# Patient Record
Sex: Female | Born: 1970 | Race: Black or African American | Hispanic: No | Marital: Married | Smoking: Never smoker
Health system: Southern US, Community
[De-identification: ages and names within clinical notes are randomized; demographics above are authoritative.]

## PROBLEM LIST (undated history)

## (undated) DIAGNOSIS — F32A Depression, unspecified: Secondary | ICD-10-CM

## (undated) DIAGNOSIS — E039 Hypothyroidism, unspecified: Secondary | ICD-10-CM

## (undated) DIAGNOSIS — D638 Anemia in other chronic diseases classified elsewhere: Secondary | ICD-10-CM

## (undated) DIAGNOSIS — I9589 Other hypotension: Secondary | ICD-10-CM

## (undated) DIAGNOSIS — G4730 Sleep apnea, unspecified: Secondary | ICD-10-CM

## (undated) DIAGNOSIS — N186 End stage renal disease: Secondary | ICD-10-CM

## (undated) DIAGNOSIS — R079 Chest pain, unspecified: Secondary | ICD-10-CM

## (undated) DIAGNOSIS — E669 Obesity, unspecified: Secondary | ICD-10-CM

## (undated) DIAGNOSIS — M25551 Pain in right hip: Secondary | ICD-10-CM

## (undated) DIAGNOSIS — E119 Type 2 diabetes mellitus without complications: Secondary | ICD-10-CM

## (undated) DIAGNOSIS — G8929 Other chronic pain: Secondary | ICD-10-CM

## (undated) DIAGNOSIS — I5189 Other ill-defined heart diseases: Secondary | ICD-10-CM

## (undated) DIAGNOSIS — E785 Hyperlipidemia, unspecified: Secondary | ICD-10-CM

## (undated) DIAGNOSIS — E875 Hyperkalemia: Secondary | ICD-10-CM

## (undated) DIAGNOSIS — F329 Major depressive disorder, single episode, unspecified: Secondary | ICD-10-CM

---

## 2018-11-24 ENCOUNTER — Encounter: Payer: Self-pay | Admitting: Emergency Medicine

## 2018-11-24 ENCOUNTER — Emergency Department
Admission: EM | Admit: 2018-11-24 | Discharge: 2018-11-24 | Disposition: A | Discharge status: Skilled Nursing Facility | Payer: Medicare Other | Attending: Emergency Medicine | Admitting: Emergency Medicine

## 2018-11-24 DIAGNOSIS — E1122 Type 2 diabetes mellitus with diabetic chronic kidney disease: Secondary | ICD-10-CM | POA: Insufficient documentation

## 2018-11-24 DIAGNOSIS — Z992 Dependence on renal dialysis: Secondary | ICD-10-CM | POA: Insufficient documentation

## 2018-11-24 DIAGNOSIS — N186 End stage renal disease: Secondary | ICD-10-CM | POA: Insufficient documentation

## 2018-11-24 HISTORY — DX: Sleep apnea, unspecified: G47.30

## 2018-11-24 HISTORY — DX: Hyperkalemia: E87.5

## 2018-11-24 LAB — BASIC METABOLIC PANEL
Anion gap: 18 — ABNORMAL HIGH (ref 5–15)
BUN: 86 mg/dL — ABNORMAL HIGH (ref 6–20)
CALCIUM: 8.9 mg/dL (ref 8.9–10.3)
CO2: 19 mmol/L — AB (ref 22–32)
CREATININE: 8.63 mg/dL — AB (ref 0.44–1.00)
Chloride: 96 mmol/L — ABNORMAL LOW (ref 98–111)
GFR calc Af Amer: 6 mL/min — ABNORMAL LOW (ref >60)
GFR calc non Af Amer: 5 mL/min — ABNORMAL LOW (ref >60)
Glucose, Bld: 84 mg/dL (ref 70–99)
Potassium: 3.9 mmol/L (ref 3.5–5.1)
Sodium: 133 mmol/L — ABNORMAL LOW (ref 135–145)

## 2018-11-24 NOTE — Discharge Instructions (Addendum)
Please seek medical attention for any high fevers, chest pain, shortness of breath, change in behavior, persistent vomiting, bloody stool or any other new or concerning symptoms.  

## 2018-11-24 NOTE — ED Notes (Signed)
Patient had a large bowel movement.  This RN and Mel, EDT and Kadijah, EDT assisted to clean patient.

## 2018-11-24 NOTE — ED Notes (Signed)
This RN called [place] [facility] and informed staff of the plan of care for the patient at this time.

## 2018-11-24 NOTE — ED Notes (Signed)
Social worker informed that [facility] is refusing to accept patient back to facility.

## 2018-11-24 NOTE — ED Triage Notes (Signed)
Patient presents to the ED via EMS for dialysis treatment.  Patient's wheelchair did not fit in the wheelchair Lucianne Lei that was going to take patient to dialysis.  Patient is in no obvious distress at this time.

## 2018-11-24 NOTE — ED Provider Notes (Signed)
[facility] Emergency Department Provider Note   ____________________________________________   I have reviewed the triage vital signs and the nursing notes.   HISTORY  Chief Complaint Vascular Access Problem (Facility did not have appropriate equipment to transfer patient to dialysis.  )   History limited by: Not Limited   HPI Lindsey Russell is a 48 y.o. female who presents to the emergency department today because she was unable to be transported to dialysis today. Patient gets dialysis Saturday, Tuesday and Thursday and states she did get her dialysis session last Saturday. The patient denies any chest pain. Denies any shortness of breath. Apparently her wheelchair did not fit in the transport vehicle today.  Per medical record review patient has a history of end stage renal disease, DM.  Past Medical History:  Diagnosis Date  . Diabetes mellitus without complication ([place])   . Hyperkalemia   . Renal disorder    end stage renal disease  . Sleep apnea   . Thyroid disease     There are no active problems to display for this patient.   Prior to Admission medications   Not on File    Allergies Morphine and related and Naproxen  No family history on file.  Social History Social History   Tobacco Use  . Smoking status: Never Smoker  . Smokeless tobacco: Never Used  Substance Use Topics  . Alcohol use: Never    Frequency: Never  . Drug use: Not on file    Review of Systems Constitutional: No fever/chills Eyes: No visual changes. ENT: No sore throat. Cardiovascular: Denies chest pain. Respiratory: Denies shortness of breath. Gastrointestinal: Positive for diarrhea.  Genitourinary: Negative for dysuria. Musculoskeletal: Negative for back pain. Skin: Negative for rash. Neurological: Negative for headaches, focal weakness or numbness.  ____________________________________________   PHYSICAL EXAM:  VITAL SIGNS: ED Triage Vitals  [11/24/18 1508]  Enc Vitals Group     BP (!) 145/116     Pulse Rate 94     Resp 16     Temp 97.6 F (36.4 C)     Temp Source Oral     SpO2 95 %     Weight      Height      Head Circumference      Peak Flow      Pain Score 4   Constitutional: Alert and oriented.  Eyes: Conjunctivae are normal.  ENT      Head: Normocephalic and atraumatic.      Nose: No congestion/rhinnorhea.      Mouth/Throat: Mucous membranes are moist.      Neck: No stridor. Hematological/Lymphatic/Immunilogical: No cervical lymphadenopathy. Cardiovascular: Normal rate, regular rhythm.  No murmurs, rubs, or gallops. Respiratory: Normal respiratory effort without tachypnea nor retractions. Breath sounds are clear and equal bilaterally. No wheezes/rales/rhonchi. Gastrointestinal: Soft and non tender. No rebound. No guarding.  Genitourinary: Deferred Musculoskeletal: Normal range of motion in all extremities. No lower extremity edema. Neurologic:  Normal speech and language. No gross focal neurologic deficits are appreciated.  Skin:  Skin is warm, dry and intact. No rash noted. Psychiatric: Mood and affect are normal. Speech and behavior are normal. Patient exhibits appropriate insight and judgment.  ____________________________________________    LABS (pertinent positives/negatives)  BMP K 3.9  ____________________________________________   EKG  None  ____________________________________________    RADIOLOGY  None  ____________________________________________   PROCEDURES  Procedures  ____________________________________________   INITIAL IMPRESSION / ASSESSMENT AND PLAN / ED COURSE  Pertinent labs & imaging  results that were available during my care of the patient were reviewed by me and considered in my medical decision making (see chart for details).   Patient was sent to the emergency department today because they cannot arrange transport to her dialysis center.  Patient denies  any shortness of breath.  Is not in any respiratory distress.  Potassium 3.9.  At this point do not feel patient requires emergent dialysis. Will discharge back to living facility.   ____________________________________________   FINAL CLINICAL IMPRESSION(S) / ED DIAGNOSES  Final diagnoses:  Dialysis patient [facility]     Note: This dictation was prepared with Dragon dictation. Any transcriptional errors that result from this process are unintentional     Nance Pear, MD 11/24/18 [id number]

## 2018-11-24 NOTE — ED Notes (Signed)
Facility states they cannot accept patient back because her wheelchair is too large for the [place].  Spoke to [employer], Marine scientist.

## 2018-11-24 NOTE — ED Notes (Signed)
Called [facility] for transport to [place]

## 2018-11-24 NOTE — ED Notes (Signed)
This RN touched base with social work at this time.  Social worker states they are working on a plan for the patient.  Will continue to monitor.

## 2018-11-24 NOTE — Clinical Social Work Note (Signed)
Per RN patient was brought to ED today from [facility] due to not being able to fit in the transport vehicle for dialysis. CSW spoke with Claiborne Billings, liaison for [facility]. Per Claiborne Billings they have not been able to get patient into the transport vehicle due to weight (432 pounds) Claiborne Billings spoke with her Director of [place] and they have agreed to accept patient back to [facility] today. CSW notified RN Vicente Males of above. Patient will be discharged back to [facility] by EMS today.   [place], [place]

## 2018-11-24 NOTE — ED Notes (Signed)
Report given to staff at [facility] at this time.  Informed of transport back to facility.

## 2018-11-30 ENCOUNTER — Emergency Department
Admission: EM | Admit: 2018-11-30 | Discharge: 2018-12-01 | Disposition: A | Discharge status: Skilled Nursing Facility | Payer: Medicare Other | Attending: Emergency Medicine | Admitting: Emergency Medicine

## 2018-11-30 ENCOUNTER — Other Ambulatory Visit: Payer: Self-pay

## 2018-11-30 DIAGNOSIS — E1122 Type 2 diabetes mellitus with diabetic chronic kidney disease: Secondary | ICD-10-CM | POA: Diagnosis not present

## 2018-11-30 DIAGNOSIS — Y69 Unspecified misadventure during surgical and medical care: Secondary | ICD-10-CM | POA: Insufficient documentation

## 2018-11-30 DIAGNOSIS — T8242XA Displacement of vascular dialysis catheter, initial encounter: Secondary | ICD-10-CM | POA: Insufficient documentation

## 2018-11-30 DIAGNOSIS — N186 End stage renal disease: Secondary | ICD-10-CM | POA: Insufficient documentation

## 2018-11-30 LAB — COMPREHENSIVE METABOLIC PANEL
ALT: 9 U/L (ref 0–44)
AST: 13 U/L — ABNORMAL LOW (ref 15–41)
Albumin: 2.8 g/dL — ABNORMAL LOW (ref 3.5–5.0)
Alkaline Phosphatase: 88 U/L (ref 38–126)
Anion gap: 16 — ABNORMAL HIGH (ref 5–15)
BILIRUBIN TOTAL: 0.6 mg/dL (ref 0.3–1.2)
BUN: 70 mg/dL — ABNORMAL HIGH (ref 6–20)
CO2: 23 mmol/L (ref 22–32)
Calcium: 8.1 mg/dL — ABNORMAL LOW (ref 8.9–10.3)
Chloride: 98 mmol/L (ref 98–111)
Creatinine, Ser: 6.69 mg/dL — ABNORMAL HIGH (ref 0.44–1.00)
GFR calc Af Amer: 8 mL/min — ABNORMAL LOW (ref >60)
GFR calc non Af Amer: 7 mL/min — ABNORMAL LOW (ref >60)
Glucose, Bld: 95 mg/dL (ref 70–99)
POTASSIUM: 3.8 mmol/L (ref 3.5–5.1)
Sodium: 137 mmol/L (ref 135–145)
TOTAL PROTEIN: 6.5 g/dL (ref 6.5–8.1)

## 2018-11-30 LAB — CBC WITH DIFFERENTIAL/PLATELET
Abs Immature Granulocytes: 0.07 10*3/uL (ref 0.00–0.07)
Basophils Absolute: 0 10*3/uL (ref 0.0–0.1)
Basophils Relative: 1 %
Eosinophils Absolute: 0.1 10*3/uL (ref 0.0–0.5)
Eosinophils Relative: 2 %
HCT: 33.5 % — ABNORMAL LOW (ref 36.0–46.0)
Hemoglobin: 10.1 g/dL — ABNORMAL LOW (ref 12.0–15.0)
Immature Granulocytes: 1 %
Lymphocytes Relative: 17 %
Lymphs Abs: 1 10*3/uL (ref 0.7–4.0)
MCH: 26.7 pg (ref 26.0–34.0)
MCHC: 30.1 g/dL (ref 30.0–36.0)
MCV: 88.6 fL (ref 80.0–100.0)
Monocytes Absolute: 0.4 10*3/uL (ref 0.1–1.0)
Monocytes Relative: 7 %
NRBC: 0 % (ref 0.0–0.2)
Neutro Abs: 4.1 10*3/uL (ref 1.7–7.7)
Neutrophils Relative %: 72 %
Platelets: 91 10*3/uL — ABNORMAL LOW (ref 150–400)
RBC: 3.78 MIL/uL — ABNORMAL LOW (ref 3.87–5.11)
RDW: 15.9 % — AB (ref 11.5–15.5)
WBC: 5.6 10*3/uL (ref 4.0–10.5)

## 2018-11-30 MED ORDER — OXYCODONE-ACETAMINOPHEN 5-325 MG PO TABS
2.0000 | ORAL_TABLET | Freq: Once | ORAL | Status: AC
  Administered 2018-11-30: 2 via ORAL
  Filled 2018-11-30: qty 2

## 2018-11-30 NOTE — ED Notes (Signed)
Attempted to call [facility]. No answer. Will try again in 10 mins.

## 2018-11-30 NOTE — ED Notes (Signed)
Called facility multiple times. No answer. Will continue to call.

## 2018-11-30 NOTE — ED Notes (Signed)
Patient states she has dialysis 3 times a week on Tuesday, Thursday and Saturday. Patient states she made a mistake and accidentally pulled out her subclavian cath.

## 2018-11-30 NOTE — ED Provider Notes (Signed)
[facility] Emergency Department Provider Note       Time seen: ----------------------------------------- 7:49 PM on 11/30/2018 -----------------------------------------   I have reviewed the triage vital signs and the nursing notes.  HISTORY   Chief Complaint No chief complaint on file.   HPI Lindsey Russell is a 48 y.o. female with a history of diabetes, hyperkalemia, end-stage renal disease on dialysis, morbid obesity who presents to the ED for dialysis catheter dislodgment.  She has a right chest tunneled catheter that she accidentally pulled out tonight.  She denies any other injuries or complaints.  Past Medical History:  Diagnosis Date  . Diabetes mellitus without complication ([place])   . Hyperkalemia   . Renal disorder    end stage renal disease  . Sleep apnea   . Thyroid disease     There are no active problems to display for this patient.  Allergies Morphine and related and Naproxen  Social History Social History   Tobacco Use  . Smoking status: Never Smoker  . Smokeless tobacco: Never Used  Substance Use Topics  . Alcohol use: Never    Frequency: Never  . Drug use: Not on file   Review of Systems Constitutional: Negative for fever. Cardiovascular: Negative for chest pain. Respiratory: Negative for shortness of breath. Gastrointestinal: Negative for abdominal pain, vomiting and diarrhea. Musculoskeletal: Negative for back pain. Skin: Negative for rash. Neurological: Negative for headaches, focal weakness or numbness.  All systems negative/normal/unremarkable except as stated in the HPI  ____________________________________________   PHYSICAL EXAM:  VITAL SIGNS: ED Triage Vitals  Enc Vitals Group     BP      Pulse      Resp      Temp      Temp src      SpO2      Weight      Height      Head Circumference      Peak Flow      Pain Score      Pain Loc      Pain Edu?      Excl. in [place]?    Constitutional: Alert and  oriented.  Morbidly obese Cardiovascular: Normal rate, regular rhythm. No murmurs, rubs, or gallops.  Right upper chest catheter has been completely dislodged. Respiratory: Normal respiratory effort without tachypnea nor retractions. Breath sounds are clear and equal bilaterally. No wheezes/rales/rhonchi. Musculoskeletal: Severe and chronic appearing edema in the legs Neurologic:  Normal speech and language. No gross focal neurologic deficits are appreciated.  Skin:  Skin is warm, dry and intact. No rash noted. Psychiatric: Mood and affect are normal. Speech and behavior are normal.  ____________________________________________  ED COURSE:  As part of my medical decision making, I reviewed the following data within the [place] History obtained from family if available, nursing notes, old chart and ekg, as well as notes from prior ED visits. Patient presented for dialysis catheter dislodgment, we will assess with labs and discussed with nephrology   Procedures ____________________________________________   LABS (pertinent positives/negatives)  Labs Reviewed  COMPREHENSIVE METABOLIC PANEL - Abnormal; Notable for the following components:      Result Value   BUN 70 (*)    Creatinine, Ser 6.69 (*)    Calcium 8.1 (*)    Albumin 2.8 (*)    AST 13 (*)    GFR calc non Af Amer 7 (*)    GFR calc Af Amer 8 (*)    Anion gap 16 (*)  All other components within normal limits  CBC WITH DIFFERENTIAL/PLATELET   ____________________________________________   DIFFERENTIAL DIAGNOSIS   Dialysis catheter dislodgment, end-stage renal disease  FINAL ASSESSMENT AND PLAN  Dialysis catheter displacement   Plan: The patient had presented for accidentally removing her dialysis catheter. Patient's labs are reassuring.  I have discussed with both nephrology and vascular surgery.  As long as she can call the office of vascular surgery in the morning around 9:00 they can arrange for  her to have a dialysis catheter placed.  Patient is agreeable to the plan.Laurence Aly, MD    Note: This note was generated in part or whole with voice recognition software. Voice recognition is usually quite accurate but there are transcription errors that can and very often do occur. I apologize for any typographical errors that were not detected and corrected.     Earleen Newport, MD 11/30/18 2149

## 2018-11-30 NOTE — ED Triage Notes (Signed)
Pt arrived via [place] from [facility] with c/o subclavian catheter pulled out. EMS states catheter was pulled out at [facility] care with unknown of how it happened. Pt stated that when she woke up with it out.

## 2018-11-30 NOTE — ED Notes (Signed)
IV placed by IV team and lab obtained.

## 2018-11-30 NOTE — ED Notes (Signed)
Report given to Nurse at [facility] care. Informed vascular surgery needs to be called before or by 9am.

## 2018-12-01 ENCOUNTER — Other Ambulatory Visit (INDEPENDENT_AMBULATORY_CARE_PROVIDER_SITE_OTHER): Payer: Self-pay | Admitting: Nurse Practitioner

## 2018-12-02 ENCOUNTER — Encounter
Admission: RE | Disposition: A | Discharge status: Skilled Nursing Facility | Payer: Self-pay | Source: Home / Self Care | Attending: Vascular Surgery

## 2018-12-02 ENCOUNTER — Ambulatory Visit
Admission: RE | Admit: 2018-12-02 | Discharge: 2018-12-02 | Disposition: A | Discharge status: Skilled Nursing Facility | Payer: Medicare Other | Attending: Vascular Surgery | Admitting: Vascular Surgery

## 2018-12-02 ENCOUNTER — Other Ambulatory Visit: Payer: Self-pay

## 2018-12-02 DIAGNOSIS — N186 End stage renal disease: Secondary | ICD-10-CM | POA: Diagnosis not present

## 2018-12-02 DIAGNOSIS — Z6841 Body Mass Index (BMI) 40.0 and over, adult: Secondary | ICD-10-CM

## 2018-12-02 DIAGNOSIS — Z886 Allergy status to analgesic agent status: Secondary | ICD-10-CM | POA: Insufficient documentation

## 2018-12-02 DIAGNOSIS — E1122 Type 2 diabetes mellitus with diabetic chronic kidney disease: Secondary | ICD-10-CM

## 2018-12-02 DIAGNOSIS — I871 Compression of vein: Secondary | ICD-10-CM

## 2018-12-02 DIAGNOSIS — T8249XA Other complication of vascular dialysis catheter, initial encounter: Secondary | ICD-10-CM | POA: Diagnosis not present

## 2018-12-02 DIAGNOSIS — I82602 Acute embolism and thrombosis of unspecified veins of left upper extremity: Secondary | ICD-10-CM | POA: Insufficient documentation

## 2018-12-02 DIAGNOSIS — E079 Disorder of thyroid, unspecified: Secondary | ICD-10-CM | POA: Insufficient documentation

## 2018-12-02 DIAGNOSIS — Z885 Allergy status to narcotic agent status: Secondary | ICD-10-CM | POA: Insufficient documentation

## 2018-12-02 DIAGNOSIS — Z992 Dependence on renal dialysis: Secondary | ICD-10-CM

## 2018-12-02 DIAGNOSIS — G4730 Sleep apnea, unspecified: Secondary | ICD-10-CM | POA: Insufficient documentation

## 2018-12-02 HISTORY — PX: DIALYSIS/PERMA CATHETER INSERTION: CATH118288

## 2018-12-02 LAB — GLUCOSE, CAPILLARY
GLUCOSE-CAPILLARY: 76 mg/dL (ref 70–99)
Glucose-Capillary: 73 mg/dL (ref 70–99)
Glucose-Capillary: 115 mg/dL — ABNORMAL HIGH (ref 70–99)

## 2018-12-02 LAB — POTASSIUM (ARMC VASCULAR LAB ONLY): Potassium (ARMC vascular lab): 4.3 (ref 3.5–5.1)

## 2018-12-02 SURGERY — DIALYSIS/PERMA CATHETER INSERTION
Anesthesia: Moderate Sedation

## 2018-12-02 MED ORDER — MIDAZOLAM HCL 2 MG/2ML IJ SOLN
INTRAMUSCULAR | Status: AC
  Filled 2018-12-02: qty 4

## 2018-12-02 MED ORDER — CEFAZOLIN SODIUM-DEXTROSE 1-4 GM/50ML-% IV SOLN
1.0000 g | Freq: Once | INTRAVENOUS | Status: AC
  Administered 2018-12-02: 1 g via INTRAVENOUS

## 2018-12-02 MED ORDER — MIDAZOLAM HCL 2 MG/2ML IJ SOLN
INTRAMUSCULAR | Status: DC | PRN
  Administered 2018-12-02: 2 mg via INTRAVENOUS

## 2018-12-02 MED ORDER — FENTANYL CITRATE (PF) 100 MCG/2ML IJ SOLN
INTRAMUSCULAR | Status: AC
  Filled 2018-12-02: qty 2

## 2018-12-02 MED ORDER — METHYLPREDNISOLONE SODIUM SUCC 125 MG IJ SOLR: 125.0000 mg | Freq: Once | INTRAMUSCULAR | Status: DC | PRN

## 2018-12-02 MED ORDER — DEXTROSE 50 % IV SOLN
1.0000 | Freq: Once | INTRAVENOUS | Status: AC
  Administered 2018-12-02: 50 mL via INTRAVENOUS

## 2018-12-02 MED ORDER — DEXTROSE 50 % IV SOLN
INTRAVENOUS | Status: AC
  Filled 2018-12-02: qty 50

## 2018-12-02 MED ORDER — ONDANSETRON HCL 4 MG/2ML IJ SOLN: 4.0000 mg | Freq: Four times a day (QID) | INTRAMUSCULAR | Status: DC | PRN

## 2018-12-02 MED ORDER — SODIUM CHLORIDE 0.9 % IV SOLN
INTRAVENOUS | Status: DC
  Administered 2018-12-02: 15:00:00 via INTRAVENOUS

## 2018-12-02 MED ORDER — IOHEXOL 300 MG/ML  SOLN
INTRAMUSCULAR | Status: DC | PRN
  Administered 2018-12-02: 25 mL via INTRAVENOUS

## 2018-12-02 MED ORDER — FENTANYL CITRATE (PF) 100 MCG/2ML IJ SOLN
INTRAMUSCULAR | Status: DC | PRN
  Administered 2018-12-02: 18:00:00
  Administered 2018-12-02 (×2): 50 ug via INTRAVENOUS

## 2018-12-02 MED ORDER — MIDAZOLAM HCL 2 MG/ML PO SYRP: 8.0000 mg | ORAL_SOLUTION | Freq: Once | ORAL | Status: DC | PRN

## 2018-12-02 MED ORDER — FAMOTIDINE 20 MG PO TABS: 40.0000 mg | ORAL_TABLET | Freq: Once | ORAL | Status: DC | PRN

## 2018-12-02 MED ORDER — FENTANYL CITRATE (PF) 100 MCG/2ML IJ SOLN: 12.5000 ug | Freq: Once | INTRAMUSCULAR | Status: DC | PRN

## 2018-12-02 MED ORDER — MIDAZOLAM HCL 2 MG/2ML IJ SOLN
INTRAMUSCULAR | Status: DC | PRN
  Administered 2018-12-02 (×2): 1 mg via INTRAVENOUS

## 2018-12-02 MED ORDER — LIDOCAINE-EPINEPHRINE (PF) 1 %-1:200000 IJ SOLN
INTRAMUSCULAR | Status: DC | PRN
  Administered 2018-12-02: 6 mL

## 2018-12-02 MED ORDER — DIPHENHYDRAMINE HCL 50 MG/ML IJ SOLN: 50.0000 mg | Freq: Once | INTRAMUSCULAR | Status: DC | PRN

## 2018-12-02 SURGICAL SUPPLY — 22 items
BALLN ULTRVRSE 12X80X75 (BALLOONS) ×3
BALLN ULTRVRSE 8X80X75 (BALLOONS) ×3
BALLOON ULTRVRSE 12X80X75 (BALLOONS) ×1 IMPLANT
BALLOON ULTRVRSE 8X80X75 (BALLOONS) ×1 IMPLANT
BIOPATCH RED 1 DISK 7.0 (GAUZE/BANDAGES/DRESSINGS) ×2 IMPLANT
BIOPATCH RED 1IN DISK 7.0MM (GAUZE/BANDAGES/DRESSINGS) ×1
CANNULA 5F STIFF (CANNULA) ×3 IMPLANT
CATH BEACON 5 .035 40 KMP TP (CATHETERS) ×1 IMPLANT
CATH BEACON 5 .038 40 KMP TP (CATHETERS) ×2
CATH DIALYSIS PAL 33 888814504 (CATHETERS) ×3 IMPLANT
CATH PALINDROME RT-P 15FX28CM (CATHETERS) ×3 IMPLANT
DEVICE PRESTO INFLATION (MISCELLANEOUS) ×3 IMPLANT
DEVICE TORQUE .025-.038 (MISCELLANEOUS) ×6 IMPLANT
GLIDEWIRE STIFF .35X180X3 HYDR (WIRE) ×6 IMPLANT
GUIDEWIRE SUPER STIFF .035X180 (WIRE) ×3 IMPLANT
PACK ANGIOGRAPHY (CUSTOM PROCEDURE TRAY) ×3 IMPLANT
SHEATH BRITE TIP 5FRX11 (SHEATH) ×3 IMPLANT
SHEATH PINNACLE 11FRX10 (SHEATH) ×3 IMPLANT
SUT MNCRL AB 4-0 PS2 18 (SUTURE) ×3 IMPLANT
SUT PROLENE 0 CT 1 30 (SUTURE) ×3 IMPLANT
SUT VIC AB 3-0 SH 27 (SUTURE) ×2
SUT VIC AB 3-0 SH 27X BRD (SUTURE) ×1 IMPLANT

## 2018-12-02 NOTE — Discharge Instructions (Signed)
Tunneled Catheter Insertion, Care After Refer to this sheet in the next few weeks. These instructions provide you with information about caring for yourself after your procedure. Your health care provider may also give you more specific instructions. Your treatment has been planned according to current medical practices, but problems sometimes occur. Call your health care provider if you have any problems or questions after your procedure. What can I expect after the procedure? After the procedure, it is common to have:  Some mild redness, swelling, and pain around your catheter site.  A small amount of blood or clear fluid coming from your incisions. Follow these instructions at home: Incision care  Check your incision areas every day for signs of infection. Check for: ? More redness, swelling, or pain. ? More fluid or blood. ? Warmth. ? Pus or a bad smell.  Follow instructions from your health care provider about how to take care of your incisions. Make sure you: ? Wash your hands with soap and water before you change your bandages (dressings). If soap and water are not available, use hand sanitizer. ? Change your dressings as told by your health care provider. Wash the area around your incisions with a germ-killing (antiseptic) solution when you change your dressing, as told by your health care provider. ? Leave stitches (sutures), skin glue, or adhesive strips in place. These skin closures may need to stay in place for 2 weeks or longer. If adhesive strip edges start to loosen and curl up, you may trim the loose edges. Do not remove adhesive strips completely unless your health care provider tells you to do that. Catheter Care   Wash your hands with soap and water before and after caring for your catheter. If soap and water are not available, use hand sanitizer.  Keep your catheter site and your dressings clean and dry.  Apply an antibiotic ointment to your catheter site as told by  your health care provider.  Flush your catheter as told by your health care provider. This helps prevent it from becoming clogged.  Do not open the caps on the ends of the catheter.  Do not pull on your catheter.  If your catheter is in your arm: ? Avoid wearing tight clothes or tight jewelry on your arm that has the catheter. ? Do not sleep with your head on the arm that has the catheter. ? Do not allow your blood pressure to be taken on the arm that has the catheter. ? Do not allow your blood to be drawn from the arm that has the catheter, except through the catheter itself. Medicines  Take over-the-counter and prescription medicines only as told by your health care provider.  If you were prescribed an antibiotic medicine, take it as told by your health care provider. Do not stop taking the antibiotic even if you start to feel better. Activity  Return to your normal activities as told by your health care provider. Ask your health care provider what activities are safe for you.  Do not lift anything that is heavier than 10 lb (4.5 kg) for 3 weeks or as long as told by your health care provider. Driving  Do not drive until your health care provider approves.  Do not drive or operate heavy machinery while taking prescription pain medicine. General instructions  Follow your health care provider's specific instructions for the type of catheter that you have.  Do not take baths, swim, or use a hot tub until your health care  provider approves.  Follow instructions from your health care provider about eating or drinking restrictions.  Wear compression stockings as told by your health care provider. These stockings help to prevent blood clots and reduce swelling in your legs.  Keep all follow-up visits as told by your health care provider. This is important. Contact a health care provider if:  You have more fluid or blood coming from your incisions.  You have more redness,  swelling, or pain at your incisions or around the area where your catheter is inserted.  Your incisions feel warm to the touch.  You feel unusually weak.  You feel nauseous.  Your catheter is not working properly.  You have blood or fluid draining from your catheter.  You are unable to flush your catheter. Get help right away if:  Your catheter breaks.  A hole develops in your catheter.  Your catheter comes loose or gets pulled completely out. If this happens, press on your catheter site firmly with your hand or a clean cloth until you get medical help.  Your catheter becomes blocked.  You have swelling in your arm, shoulder, neck, or face.  You develop chest pain.  You have difficulty breathing.  You feel dizzy or light-headed.  You have pus or a bad smell coming from your incisions.  You have a fever.  You develop bleeding from your catheter or your insertion site, and your bleeding does not stop. This information is not intended to replace advice given to you by your health care provider. Make sure you discuss any questions you have with your health care provider. Document Released: 09/02/2012 Document Revised: 05/19/2016 Document Reviewed: 06/12/2015 Elsevier Interactive Patient Education  2019 [place]. Moderate Conscious Sedation, Adult, Care After These instructions provide you with information about caring for yourself after your procedure. Your health care provider may also give you more specific instructions. Your treatment has been planned according to current medical practices, but problems sometimes occur. Call your health care provider if you have any problems or questions after your procedure. What can I expect after the procedure? After your procedure, it is common:  To feel sleepy for several hours.  To feel clumsy and have poor balance for several hours.  To have poor judgment for several hours.  To vomit if you eat too soon. Follow these  instructions at home: For at least 24 hours after the procedure:   Do not: ? Participate in activities where you could fall or become injured. ? Drive. ? Use heavy machinery. ? Drink alcohol. ? Take sleeping pills or medicines that cause drowsiness. ? Make important decisions or sign legal documents. ? Take care of children on your own.  Rest. Eating and drinking  Follow the diet recommended by your health care provider.  If you vomit: ? Drink water, juice, or soup when you can drink without vomiting. ? Make sure you have little or no nausea before eating solid foods. General instructions  Have a responsible adult stay with you until you are awake and alert.  Take over-the-counter and prescription medicines only as told by your health care provider.  If you smoke, do not smoke without supervision.  Keep all follow-up visits as told by your health care provider. This is important. Contact a health care provider if:  You keep feeling nauseous or you keep vomiting.  You feel light-headed.  You develop a rash.  You have a fever. Get help right away if:  You have trouble breathing. This  information is not intended to replace advice given to you by your health care provider. Make sure you discuss any questions you have with your health care provider. Document Released: 07/07/2013 Document Revised: 02/19/2016 Document Reviewed: 01/06/2016 Elsevier Interactive Patient Education  2019 [employer].

## 2018-12-02 NOTE — Op Note (Signed)
OPERATIVE NOTE    PRE-OPERATIVE DIAGNOSIS: 1. 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 Ultrasound guidance for vascular access to the left internal jugular vein 2. Catheter placement into the SVC and right atrium from left jugular approach 3. Left jugular venogram and superior venacavogram 4. Percutaneous transluminal angioplasty of the left jugular and innominate veins with 8 mm diameter angioplasty balloon 5. Percutaneous transluminal angioplasty of the superior vena cava with 8 and 12 mm diameter angioplasty balloons 6. Fluoroscopic guidance for placement of catheter 7. Placement of a 33 cm tip to cuff tunneled hemodialysis catheter via the left internal jugular vein  SURGEON: Leotis Pain, MD  ANESTHESIA:  Local with Moderate conscious sedation for approximately 50 minutes using 2 mg of Versed and 100 Mcg of Fentanyl  ESTIMATED BLOOD LOSS: 30 cc  FLUORO TIME: 8.6 minutes  CONTRAST: 5 cc  FINDING(S): 1.  Patent left internal jugular vein in the neck with occlusion at the clavicle.  There was reconstitution of the superior vena cava just above the right atrium but multiple collaterals were present.  SPECIMEN(S):  None  INDICATIONS:   Lindsey Russell is a 48 y.o. female who presents with dislodgment of her PermCath and no dialysis access.  The patient needs long term dialysis access for their ESRD, and a Permcath is necessary.  Risks and benefits are discussed and informed consent is obtained.    DESCRIPTION: After obtaining full informed written consent, the patient was brought back to the vascular suited. The patient's left neck and chest were sterilely prepped and draped in a sterile surgical field was created. Moderate conscious sedation was administered during a face to face encounter with the patient throughout the procedure with my supervision of the RN administering medicines and monitoring the patient's vital signs, pulse  oximetry, telemetry and mental status throughout from the start of the procedure until the patient was taken to the recovery room.  The left internal jugular vein was visualized with ultrasound and found to be patent in the neck. It was then accessed under direct ultrasound guidance and a permanent image was recorded. A wire was placed but would not easily traverse beyond the clavicle.  A micropuncture sheath was placed and imaging was performed initially through the micropuncture sheath demonstrating occlusion of the jugular and left innominate vein at the level of the clavicle.  There was faint reconstitution of the superior vena cava just above the right atrium on initial imaging.  At this point, we placed a 5 French sheath.  Tediously, I was able to cross the occlusion with a Kumpe catheter and a Glidewire.  I advanced a Kumpe catheter and confirmed intraluminal flow in the superior vena cava and then the right atrium.  Angioplasty was then performed an 8 mm diameter by 8 cm length angioplasty balloon was first inflated in the left jugular and innominate vein with a tight waist taken which resolved at about 10 atm.  It was then advanced the superior vena cava and inflated to 12 atm.  Both inflations were for 1 minute.  Attempts at advancing the catheter at this point were still unsuccessful and so I upsized to a 12 mm diameter by 8 cm length angioplasty balloon in the superior vena cava.  Again a tight waist was seen which resolved at about 8 atm.  At this point, completion imaging showed mild to moderate residual stenosis in both the left jugular, innominate, and superior vena cava but now with  flow and a catheter could then be placed.  After skin nick and dilatation, the peel-away sheath was placed over the wire. I then turned my attention to an area under the clavicle. Approximately 1-2 fingerbreadths below the clavicle a small counterincision was created and tunneled from the subclavicular incision to the  access site. Using fluoroscopic guidance, a 33 centimeter tip to cuff tunneled hemodialysis catheter was selected, and tunneled from the subclavicular incision to the access site. It was then placed through the peel-away sheath and the peel-away sheath was removed.  This was placed over the wire to ensure that access was not lost.  Using fluoroscopic guidance the catheter tips were parked in the right atrium. The appropriate distal connectors were placed. It withdrew blood well and flushed easily with heparinized saline and a concentrated heparin solution was then placed. It was secured to the chest wall with 2 Prolene sutures. The access incision was closed single 4-0 Monocryl. A 4-0 Monocryl pursestring suture was placed around the exit site. Sterile dressings were placed. The patient tolerated the procedure well and was taken to the recovery room in stable condition.  COMPLICATIONS: None  CONDITION: Stable  Leotis Pain  12/02/2018, 5:42 PM   This note was created with Dragon Medical transcription system. Any errors in dictation are purely unintentional.

## 2018-12-02 NOTE — Consult Note (Signed)
Lindsey Russell Consult Note  MRN : [id number]  Lindsey Russell is a 48 y.o. (1971-03-08) female who presents with chief complaint of No chief complaint on file. Lindsey Russell  History of Present Illness: I am asked to see the patient by Dr. Jimmye Norman in the ER after her Permcath "came out" earlier this week.  She has long standing ESRD and has been using this catheter for her HD. She feels well.  No complaints other than her typical SOB and weakness.  No fever or chills.    Current Facility-Administered Medications  Medication Dose Route Frequency Provider Last Rate Last Dose  . dextrose 50 % solution           . 0.9 %  sodium chloride infusion   Intravenous Continuous Kris Hartmann, NP 10 mL/hr at 12/02/18 1440    . ceFAZolin (ANCEF) IVPB 1 g/50 mL premix  1 g Intravenous Once Kris Hartmann, NP      . diphenhydrAMINE (BENADRYL) injection 50 mg  50 mg Intravenous Once PRN Kris Hartmann, NP      . famotidine (PEPCID) tablet 40 mg  40 mg Oral Once PRN Kris Hartmann, NP      . fentaNYL (SUBLIMAZE) injection 12.5 mcg  12.5 mcg Intravenous Once PRN Eulogio Ditch E, NP      . methylPREDNISolone sodium succinate (SOLU-MEDROL) 125 mg/2 mL injection 125 mg  125 mg Intravenous Once PRN Eulogio Ditch E, NP      . midazolam (VERSED) 2 MG/ML syrup 8 mg  8 mg Oral Once PRN Kris Hartmann, NP      . ondansetron (ZOFRAN) injection 4 mg  4 mg Intravenous Q6H PRN Kris Hartmann, NP        Past Medical History:  Diagnosis Date  . Diabetes mellitus without complication ([place])   . Hyperkalemia   . Renal disorder    end stage renal disease  . Sleep apnea   . Thyroid disease     Past Surgical History:  Procedure Laterality Date  . CESAREAN SECTION     x 2    Social History Social History   Tobacco Use  . Smoking status: Never Smoker  . Smokeless tobacco: Never Used  Substance Use Topics  . Alcohol use: Never    Frequency: Never  . Drug use: Not on file    Family  History No bleeding disorders, clotting disorders, autoimmune diseases or aneurysms  Allergies  Allergen Reactions  . Morphine And Related   . Naproxen      REVIEW OF SYSTEMS (Negative unless checked)  Constitutional: [] Weight loss  [] Fever  [] Chills Cardiac: [] Chest pain   [] Chest pressure   [] Palpitations   [] Shortness of breath when laying flat   [x] Shortness of breath at rest   [x] Shortness of breath with exertion. Russell:  [] Pain in legs with walking   [] Pain in legs at rest   [] Pain in legs when laying flat   [] Claudication   [] Pain in feet when walking  [] Pain in feet at rest  [] Pain in feet when laying flat   [] History of DVT   [] Phlebitis   [] Swelling in legs   [] Varicose veins   [] Non-healing ulcers Pulmonary:   [] Uses home oxygen   [] Productive cough   [] Hemoptysis   [] Wheeze  [] COPD   [] Asthma Neurologic:  [] Dizziness  [] Blackouts   [] Seizures   [] History of stroke   [] History of TIA  [] Aphasia   [] Temporary blindness   []   Dysphagia   [] Weakness or numbness in arms   [] Weakness or numbness in legs Musculoskeletal:  [x] Arthritis   [] Joint swelling   [] Joint pain   [] Low back pain Hematologic:  [] Easy bruising  [] Easy bleeding   [] Hypercoagulable state   [] Anemic  [] Hepatitis Gastrointestinal:  [] Blood in stool   [] Vomiting blood  [] Gastroesophageal reflux/heartburn   [] Difficulty swallowing. Genitourinary:  [x] Chronic kidney disease   [] Difficult urination  [] Frequent urination  [] Burning with urination   [] Blood in urine Skin:  [] Rashes   [] Ulcers   [] Wounds Psychological:  [] History of anxiety   []  History of major depression.  Physical Examination  Vitals:   12/02/18 1435  BP: 108/78  Pulse: 90  Resp: 20  Temp: 98 F (36.7 C)  TempSrc: Oral  SpO2: 100%  Weight: (!) 181.4 kg  Height: 5\' 6"  (1.676 m)   Body mass index is 64.56 kg/m. Gen:  WD/WN, NAD. Massively obese. Head: [place]/AT, No temporalis wasting.  Ear/Nose/Throat: Hearing grossly intact, nares w/o erythema  or drainage, oropharynx w/o Erythema/Exudate Eyes: Sclera non-icteric, conjunctiva clear Neck: Trachea midline.  No JVD.  Pulmonary:  Good air movement, respirations mildly labored at rest Cardiac: RRR, no JVD Russell:  Vessel Right Left  Radial Palpable Palpable   Musculoskeletal: M/S 5/5 throughout.  Extremities without ischemic changes.  No deformity or atrophy. 2+BLE  edema. Neurologic: Sensation grossly intact in extremities.  Symmetrical.  Speech is fluent. Motor exam as listed above. Psychiatric: Judgment intact, Mood & affect appropriate for pt's clinical situation. Dermatologic: No rashes or ulcers noted.  No cellulitis or open wounds.       CBC Lab Results  Component Value Date   WBC 5.6 11/30/2018   HGB 10.1 (L) 11/30/2018   HCT 33.5 (L) 11/30/2018   MCV 88.6 11/30/2018   PLT 91 (L) 11/30/2018    BMET    Component Value Date/Time   NA 137 11/30/2018 2004   K 3.8 11/30/2018 2004   CL 98 11/30/2018 2004   CO2 23 11/30/2018 2004   GLUCOSE 95 11/30/2018 2004   BUN 70 (H) 11/30/2018 2004   CREATININE 6.69 (H) 11/30/2018 2004   CALCIUM 8.1 (L) 11/30/2018 2004   GFRNONAA 7 (L) 11/30/2018 2004   GFRAA 8 (L) 11/30/2018 2004   Estimated Creatinine Clearance: 17.4 mL/min (A) (by C-G formula based on SCr of 6.69 mg/dL (H)).  COAG No results found for: INR, PROTIME  Radiology No results found.    Assessment/Plan 1. ESRD without dialysis access.  Will place a catheter today.  Will be challenging given her size.  [place] and typical techniques for safety 2. Morbid obesity.  Anatomy will likely be difficult for catheter placement but has to have one for dialysis 3. Diabetes: stable on outpatient medication and blood glucose control important in reducing the progression of atherosclerotic disease. Also, involved in wound healing. On appropriate medications.    Leotis Pain, MD  12/02/2018 4:19 PM    This note was created with Dragon medical transcription system.   Any error is purely unintentional

## 2018-12-02 NOTE — H&P (Signed)
[facility] History & Physical Update  The patient was interviewed and re-examined.  The patient's previous History and Physical has been reviewed and is unchanged.  There is no change in the plan of care. We plan to proceed with the scheduled procedure.  Leotis Pain, MD  12/02/2018, 4:44 PM

## 2018-12-02 NOTE — Progress Notes (Signed)
Report called to RN Deatra Ina at [facility]. EMS has been called for non-emergent transport back to facility.

## 2018-12-03 ENCOUNTER — Encounter: Payer: Self-pay | Admitting: Vascular Surgery

## 2018-12-11 ENCOUNTER — Telehealth (INDEPENDENT_AMBULATORY_CARE_PROVIDER_SITE_OTHER): Payer: Self-pay

## 2018-12-11 NOTE — Telephone Encounter (Signed)
Dana from [facility] sent over an order for the patient to have a permcath exchange. 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 This information was given to [facility] and she stated she will let Dr. Holley Raring know.

## 2018-12-12 ENCOUNTER — Encounter: Payer: Self-pay | Admitting: Emergency Medicine

## 2018-12-12 ENCOUNTER — Other Ambulatory Visit: Payer: Self-pay

## 2018-12-12 ENCOUNTER — Emergency Department
Admission: EM | Admit: 2018-12-12 | Discharge: 2018-12-12 | Disposition: A | Discharge status: Home / Self Care | Payer: Medicare Other | Source: Home / Self Care | Attending: Emergency Medicine | Admitting: Emergency Medicine

## 2018-12-12 DIAGNOSIS — T8249XA Other complication of vascular dialysis catheter, initial encounter: Secondary | ICD-10-CM

## 2018-12-12 DIAGNOSIS — Z789 Other specified health status: Secondary | ICD-10-CM | POA: Diagnosis not present

## 2018-12-12 DIAGNOSIS — N186 End stage renal disease: Secondary | ICD-10-CM

## 2018-12-12 DIAGNOSIS — Z79899 Other long term (current) drug therapy: Secondary | ICD-10-CM

## 2018-12-12 DIAGNOSIS — Y791 Therapeutic (nonsurgical) and rehabilitative orthopedic devices associated with adverse incidents: Secondary | ICD-10-CM

## 2018-12-12 DIAGNOSIS — Z992 Dependence on renal dialysis: Secondary | ICD-10-CM

## 2018-12-12 DIAGNOSIS — E1122 Type 2 diabetes mellitus with diabetic chronic kidney disease: Secondary | ICD-10-CM

## 2018-12-12 DIAGNOSIS — T80211A Bloodstream infection due to central venous catheter, initial encounter: Secondary | ICD-10-CM | POA: Diagnosis not present

## 2018-12-12 LAB — COMPREHENSIVE METABOLIC PANEL
ALT: 7 U/L (ref 0–44)
AST: 13 U/L — ABNORMAL LOW (ref 15–41)
Albumin: 2.2 g/dL — ABNORMAL LOW (ref 3.5–5.0)
Alkaline Phosphatase: 81 U/L (ref 38–126)
Anion gap: 17 — ABNORMAL HIGH (ref 5–15)
BUN: 115 mg/dL — ABNORMAL HIGH (ref 6–20)
CHLORIDE: 97 mmol/L — AB (ref 98–111)
CO2: 23 mmol/L (ref 22–32)
Calcium: 8.2 mg/dL — ABNORMAL LOW (ref 8.9–10.3)
Creatinine, Ser: 8.81 mg/dL — ABNORMAL HIGH (ref 0.44–1.00)
GFR calc Af Amer: 6 mL/min — ABNORMAL LOW (ref >60)
GFR calc non Af Amer: 5 mL/min — ABNORMAL LOW (ref >60)
Glucose, Bld: 105 mg/dL — ABNORMAL HIGH (ref 70–99)
Potassium: 4.2 mmol/L (ref 3.5–5.1)
Sodium: 137 mmol/L (ref 135–145)
Total Bilirubin: 0.8 mg/dL (ref 0.3–1.2)
Total Protein: 6.3 g/dL — ABNORMAL LOW (ref 6.5–8.1)

## 2018-12-12 LAB — LIPASE, BLOOD: LIPASE: 21 U/L (ref 11–51)

## 2018-12-12 LAB — CBC
HEMATOCRIT: 29 % — AB (ref 36.0–46.0)
Hemoglobin: 8.8 g/dL — ABNORMAL LOW (ref 12.0–15.0)
MCH: 27.3 pg (ref 26.0–34.0)
MCHC: 30.3 g/dL (ref 30.0–36.0)
MCV: 90.1 fL (ref 80.0–100.0)
Platelets: 66 10*3/uL — ABNORMAL LOW (ref 150–400)
RBC: 3.22 MIL/uL — ABNORMAL LOW (ref 3.87–5.11)
RDW: 16.2 % — ABNORMAL HIGH (ref 11.5–15.5)
WBC: 5.5 10*3/uL (ref 4.0–10.5)
nRBC: 0 % (ref 0.0–0.2)

## 2018-12-12 MED ORDER — SODIUM CHLORIDE 0.9% FLUSH: 3.0000 mL | Freq: Once | INTRAVENOUS | Status: DC

## 2018-12-12 MED ORDER — SODIUM POLYSTYRENE SULFONATE 15 GM/60ML PO SUSP: 30.0000 g | Freq: Once | ORAL | 0 refills | Status: AC

## 2018-12-12 NOTE — ED Provider Notes (Signed)
[facility] Emergency Department Provider Note  Time seen: 1:15 PM  I have reviewed the triage vital signs and the nursing notes.   HISTORY  Chief Complaint Vascular Access Problem and Diarrhea    HPI Lindsey Russell is a 48 y.o. female with a past medical history of diabetes, ESRD on dialysis Tuesday/Thursday/Saturday, presents to the emergency department with a clotted fistula.  According to the patient and EMS report patient had been diagnosed with C. difficile approximately 1.5 months ago is currently at a nursing facility being treated.  Patient states she has been experiencing intermittent abdominal pain ever since that time.  Patient is also on dialysis Tuesday/Thursday/Saturday.  Patient went to dialysis today but they were unable to access her dialysis catheter which was left subclavian.  Suspect likely clotted catheter and they sent the patient to the emergency department for evaluation of her catheter.  Here the patient is awake alert, oriented, no acute distress but is tearful at times about having C. difficile and about having dialysis, but does not appear to be in any acute discomfort.   Past Medical History:  Diagnosis Date  . Diabetes mellitus without complication ([place])   . Hyperkalemia   . Renal disorder    end stage renal disease  . Sleep apnea   . Thyroid disease     There are no active problems to display for this patient.   Past Surgical History:  Procedure Laterality Date  . CESAREAN SECTION     x 2  . DIALYSIS/PERMA CATHETER INSERTION N/A 12/02/2018   Procedure: DIALYSIS/PERMA CATHETER INSERTION;  Surgeon: Algernon Huxley, MD;  Location: [facility];  Service: Cardiovascular;  Laterality: N/A;    Prior to Admission medications   Medication Sig Start Date End Date Taking? Authorizing Provider  buPROPion (WELLBUTRIN XL) 300 MG 24 hr tablet Take 300 mg by mouth daily.    Care, [facility]  cinacalcet (SENSIPAR) 30 MG tablet  Take 30 mg by mouth daily with breakfast.    Care, [facility]  docusate sodium (COLACE) 100 MG capsule Take 100 mg by mouth daily.    Care, [facility]  FERRIC CITRATE PO Take 210 mg by mouth 3 (three) times daily.    Care, [facility]  HYDROcodone-acetaminophen (NORCO/VICODIN) 5-325 MG tablet Take 1 tablet by mouth every 6 (six) hours as needed for moderate pain.    Care, [facility]  levothyroxine (SYNTHROID, LEVOTHROID) 75 MCG tablet Take 75 mcg by mouth daily before breakfast.    Care, [facility]  midodrine (PROAMATINE) 5 MG tablet Take 5 mg by mouth 3 (three) times a week. EVERY TUES, TH, SAT. ON DIALYSIS DAYS    Care, [facility]  omeprazole (PRILOSEC) 20 MG capsule Take 20 mg by mouth daily. DELAYED RELEASE    Care, [facility]  polyethylene glycol (MIRALAX / GLYCOLAX) packet Take 17 g by mouth daily as needed.    Care, [facility]  pregabalin (LYRICA) 100 MG capsule Take 100 mg by mouth 2 (two) times daily.    Care, [facility]  promethazine (PHENERGAN) 12.5 MG tablet Take 12.5 mg by mouth every 6 (six) hours as needed for nausea or vomiting.    Care, [facility]  sevelamer carbonate (RENVELA) 800 MG tablet Take 800 mg by mouth 2 (two) times daily. WITH MEALS    Care, [facility]    Allergies  Allergen Reactions  . Morphine And Related   . Naproxen     History reviewed.  No pertinent family history.  Social History Social History   Tobacco Use  . Smoking status: Never Smoker  . Smokeless tobacco: Never Used  Substance Use Topics  . Alcohol use: Never    Frequency: Never  . Drug use: Never    Review of Systems Constitutional: Negative for fever. Cardiovascular: Negative for chest pain. Respiratory: Negative for shortness of breath. Gastrointestinal: Mid abdominal pains x2 months.  Denies any worsening today.  Cannot tell me if she is having diarrhea or normal stools, cannot tell me when her last bowel movement  was. Genitourinary: Negative for urinary compaints Musculoskeletal: Negative for musculoskeletal complaints Skin: Negative for skin complaints  Neurological: Negative for headache All other ROS negative  ____________________________________________   PHYSICAL EXAM:  VITAL SIGNS: ED Triage Vitals  Enc Vitals Group     BP 12/12/18 1214 97/62     Pulse Rate 12/12/18 1214 88     Resp 12/12/18 1214 18     Temp 12/12/18 1214 97.8 F (36.6 C)     Temp Source 12/12/18 1214 Oral     SpO2 12/12/18 1214 98 %     Weight 12/12/18 1215 (!) 399 lb 0.5 oz (181 kg)     Height 12/12/18 1215 5\' 6"  (1.676 m)     Head Circumference --      Peak Flow --      Pain Score 12/12/18 1214 5     Pain Loc --      Pain Edu? --      Excl. in [place]? --    Constitutional: Alert and oriented. Well appearing and in no distress. Eyes: Normal exam ENT   Head: Normocephalic and atraumatic   Mouth/Throat: Mucous membranes are moist. Cardiovascular: Normal rate, regular rhythm.  Respiratory: Normal respiratory effort without tachypnea nor retractions. Breath sounds are clear.  Catheter present in left subclavian. Gastrointestinal: Soft and nontender. No distention.   Musculoskeletal: Nontender with normal range of motion in all extremities.  Neurologic:  Normal speech and language. No gross focal neurologic deficits  Skin:  Skin is warm, dry and intact.  Psychiatric: Mood and affect are normal.      INITIAL IMPRESSION / ASSESSMENT AND PLAN / ED COURSE  Pertinent labs & imaging results that were available during my care of the patient were reviewed by me and considered in my medical decision making (see chart for details).  Patient presents to the emergency department for a clotted catheter.  Patient currently lives at a nursing facility, currently being treated for C. difficile it is on enteric precautions.  We will check labs and continue to closely monitor.  Patient's labs show a potassium of 4.2,  within normal limits.  Patient will require catheter evaluation by dialysis nurse to see if the catheter needs to be declotted.  I discussed the patient with Dr. Candiss Norse of nephrology.  He states they will arrange for outpatient declotting of her fistula given her potassium is normal at this time he believes the patient is safe for discharge home.  He does recommend having the patient receive a one-time dose of 30 g of Kayexalate tomorrow.  They will arrange for the patient to have outpatient declotting performed on Monday.  Patient is to remain n.p.o. after midnight on Sunday evening.  ____________________________________________   FINAL CLINICAL IMPRESSION(S) / ED DIAGNOSES  Clotted dialysis catheter   Harvest Dark, MD 12/12/18 1454

## 2018-12-12 NOTE — ED Notes (Signed)
ED Provider at bedside. 

## 2018-12-12 NOTE — Discharge Instructions (Addendum)
You should be contacted Monday morning for your declotting procedure.  Please do not eat or drink after midnight on Sunday night.  Please call the number provided for nephrology to confirm your declotting procedure.  Please take your prescribed 30 g of Kayexalate tomorrow morning 12/13/2018.  Return to the emergency department for any symptoms personally concerning to yourself.

## 2018-12-12 NOTE — ED Triage Notes (Signed)
Pt to ED via EMS from dialysis after having issues accessing right chest site.  Pt states has had diarrhea since being at [facility], reports positive for c.diff.  EMS vitals 99/50 BP, 85 HR, A&Ox4.  Pt states has dialysis Tuesday, Thursday, Saturday.  Last had dialysis Thursday.

## 2018-12-14 ENCOUNTER — Other Ambulatory Visit: Payer: Self-pay

## 2018-12-14 ENCOUNTER — Inpatient Hospital Stay
Admission: EM | Admit: 2018-12-14 | Discharge: 2018-12-30 | DRG: 314 | Disposition: E | Payer: Medicare Other | Source: Skilled Nursing Facility | Attending: Internal Medicine | Admitting: Internal Medicine

## 2018-12-14 DIAGNOSIS — Z992 Dependence on renal dialysis: Secondary | ICD-10-CM

## 2018-12-14 DIAGNOSIS — D631 Anemia in chronic kidney disease: Secondary | ICD-10-CM | POA: Diagnosis present

## 2018-12-14 DIAGNOSIS — I871 Compression of vein: Secondary | ICD-10-CM | POA: Diagnosis present

## 2018-12-14 DIAGNOSIS — Z4659 Encounter for fitting and adjustment of other gastrointestinal appliance and device: Secondary | ICD-10-CM

## 2018-12-14 DIAGNOSIS — Z515 Encounter for palliative care: Secondary | ICD-10-CM

## 2018-12-14 DIAGNOSIS — J9602 Acute respiratory failure with hypercapnia: Secondary | ICD-10-CM | POA: Diagnosis not present

## 2018-12-14 DIAGNOSIS — G9341 Metabolic encephalopathy: Secondary | ICD-10-CM | POA: Diagnosis present

## 2018-12-14 DIAGNOSIS — I38 Endocarditis, valve unspecified: Secondary | ICD-10-CM | POA: Diagnosis present

## 2018-12-14 DIAGNOSIS — Z9119 Patient's noncompliance with other medical treatment and regimen: Secondary | ICD-10-CM

## 2018-12-14 DIAGNOSIS — I132 Hypertensive heart and chronic kidney disease with heart failure and with stage 5 chronic kidney disease, or end stage renal disease: Secondary | ICD-10-CM | POA: Diagnosis present

## 2018-12-14 DIAGNOSIS — Z888 Allergy status to other drugs, medicaments and biological substances status: Secondary | ICD-10-CM | POA: Diagnosis not present

## 2018-12-14 DIAGNOSIS — D696 Thrombocytopenia, unspecified: Secondary | ICD-10-CM | POA: Diagnosis present

## 2018-12-14 DIAGNOSIS — Z9114 Patient's other noncompliance with medication regimen: Secondary | ICD-10-CM

## 2018-12-14 DIAGNOSIS — K219 Gastro-esophageal reflux disease without esophagitis: Secondary | ICD-10-CM | POA: Diagnosis present

## 2018-12-14 DIAGNOSIS — Z66 Do not resuscitate: Secondary | ICD-10-CM | POA: Diagnosis not present

## 2018-12-14 DIAGNOSIS — F329 Major depressive disorder, single episode, unspecified: Secondary | ICD-10-CM | POA: Diagnosis present

## 2018-12-14 DIAGNOSIS — E1122 Type 2 diabetes mellitus with diabetic chronic kidney disease: Secondary | ICD-10-CM | POA: Diagnosis not present

## 2018-12-14 DIAGNOSIS — Z6841 Body Mass Index (BMI) 40.0 and over, adult: Secondary | ICD-10-CM | POA: Diagnosis not present

## 2018-12-14 DIAGNOSIS — Z7189 Other specified counseling: Secondary | ICD-10-CM

## 2018-12-14 DIAGNOSIS — Z885 Allergy status to narcotic agent status: Secondary | ICD-10-CM | POA: Diagnosis not present

## 2018-12-14 DIAGNOSIS — R6521 Severe sepsis with septic shock: Secondary | ICD-10-CM | POA: Diagnosis present

## 2018-12-14 DIAGNOSIS — Z79890 Hormone replacement therapy (postmenopausal): Secondary | ICD-10-CM

## 2018-12-14 DIAGNOSIS — Z789 Other specified health status: Secondary | ICD-10-CM

## 2018-12-14 DIAGNOSIS — Z978 Presence of other specified devices: Secondary | ICD-10-CM

## 2018-12-14 DIAGNOSIS — R197 Diarrhea, unspecified: Secondary | ICD-10-CM | POA: Diagnosis present

## 2018-12-14 DIAGNOSIS — E872 Acidosis: Secondary | ICD-10-CM | POA: Diagnosis present

## 2018-12-14 DIAGNOSIS — Z79891 Long term (current) use of opiate analgesic: Secondary | ICD-10-CM

## 2018-12-14 DIAGNOSIS — I120 Hypertensive chronic kidney disease with stage 5 chronic kidney disease or end stage renal disease: Secondary | ICD-10-CM | POA: Diagnosis not present

## 2018-12-14 DIAGNOSIS — R627 Adult failure to thrive: Secondary | ICD-10-CM | POA: Diagnosis not present

## 2018-12-14 DIAGNOSIS — A419 Sepsis, unspecified organism: Secondary | ICD-10-CM | POA: Diagnosis not present

## 2018-12-14 DIAGNOSIS — M1990 Unspecified osteoarthritis, unspecified site: Secondary | ICD-10-CM | POA: Diagnosis present

## 2018-12-14 DIAGNOSIS — A4181 Sepsis due to Enterococcus: Secondary | ICD-10-CM | POA: Diagnosis present

## 2018-12-14 DIAGNOSIS — M25551 Pain in right hip: Secondary | ICD-10-CM | POA: Diagnosis not present

## 2018-12-14 DIAGNOSIS — J9601 Acute respiratory failure with hypoxia: Secondary | ICD-10-CM | POA: Diagnosis not present

## 2018-12-14 DIAGNOSIS — E039 Hypothyroidism, unspecified: Secondary | ICD-10-CM | POA: Diagnosis not present

## 2018-12-14 DIAGNOSIS — T80211A Bloodstream infection due to central venous catheter, initial encounter: Principal | ICD-10-CM | POA: Diagnosis present

## 2018-12-14 DIAGNOSIS — M5430 Sciatica, unspecified side: Secondary | ICD-10-CM | POA: Diagnosis present

## 2018-12-14 DIAGNOSIS — E11649 Type 2 diabetes mellitus with hypoglycemia without coma: Secondary | ICD-10-CM | POA: Diagnosis not present

## 2018-12-14 DIAGNOSIS — T827XXA Infection and inflammatory reaction due to other cardiac and vascular devices, implants and grafts, initial encounter: Secondary | ICD-10-CM | POA: Diagnosis not present

## 2018-12-14 DIAGNOSIS — R7881 Bacteremia: Secondary | ICD-10-CM | POA: Diagnosis not present

## 2018-12-14 DIAGNOSIS — N186 End stage renal disease: Secondary | ICD-10-CM | POA: Diagnosis not present

## 2018-12-14 DIAGNOSIS — I255 Ischemic cardiomyopathy: Secondary | ICD-10-CM | POA: Diagnosis present

## 2018-12-14 DIAGNOSIS — L8990 Pressure ulcer of unspecified site, unspecified stage: Secondary | ICD-10-CM

## 2018-12-14 DIAGNOSIS — L89152 Pressure ulcer of sacral region, stage 2: Secondary | ICD-10-CM | POA: Diagnosis present

## 2018-12-14 DIAGNOSIS — T82818A Embolism of vascular prosthetic devices, implants and grafts, initial encounter: Secondary | ICD-10-CM | POA: Diagnosis present

## 2018-12-14 DIAGNOSIS — Y838 Other surgical procedures as the cause of abnormal reaction of the patient, or of later complication, without mention of misadventure at the time of the procedure: Secondary | ICD-10-CM | POA: Diagnosis present

## 2018-12-14 DIAGNOSIS — E785 Hyperlipidemia, unspecified: Secondary | ICD-10-CM | POA: Diagnosis present

## 2018-12-14 DIAGNOSIS — N2581 Secondary hyperparathyroidism of renal origin: Secondary | ICD-10-CM | POA: Diagnosis present

## 2018-12-14 DIAGNOSIS — Z5329 Procedure and treatment not carried out because of patient's decision for other reasons: Secondary | ICD-10-CM | POA: Diagnosis present

## 2018-12-14 DIAGNOSIS — G8929 Other chronic pain: Secondary | ICD-10-CM | POA: Diagnosis present

## 2018-12-14 DIAGNOSIS — G253 Myoclonus: Secondary | ICD-10-CM | POA: Diagnosis present

## 2018-12-14 DIAGNOSIS — I469 Cardiac arrest, cause unspecified: Secondary | ICD-10-CM | POA: Diagnosis not present

## 2018-12-14 DIAGNOSIS — B952 Enterococcus as the cause of diseases classified elsewhere: Secondary | ICD-10-CM | POA: Diagnosis not present

## 2018-12-14 DIAGNOSIS — G4733 Obstructive sleep apnea (adult) (pediatric): Secondary | ICD-10-CM | POA: Diagnosis present

## 2018-12-14 DIAGNOSIS — I9589 Other hypotension: Secondary | ICD-10-CM | POA: Diagnosis present

## 2018-12-14 DIAGNOSIS — Z79899 Other long term (current) drug therapy: Secondary | ICD-10-CM | POA: Diagnosis not present

## 2018-12-14 DIAGNOSIS — I2510 Atherosclerotic heart disease of native coronary artery without angina pectoris: Secondary | ICD-10-CM | POA: Diagnosis present

## 2018-12-14 DIAGNOSIS — I509 Heart failure, unspecified: Secondary | ICD-10-CM | POA: Diagnosis present

## 2018-12-14 DIAGNOSIS — T82898A Other specified complication of vascular prosthetic devices, implants and grafts, initial encounter: Secondary | ICD-10-CM | POA: Diagnosis not present

## 2018-12-14 DIAGNOSIS — L89892 Pressure ulcer of other site, stage 2: Secondary | ICD-10-CM | POA: Diagnosis present

## 2018-12-14 DIAGNOSIS — J9600 Acute respiratory failure, unspecified whether with hypoxia or hypercapnia: Secondary | ICD-10-CM

## 2018-12-14 DIAGNOSIS — I959 Hypotension, unspecified: Secondary | ICD-10-CM | POA: Diagnosis not present

## 2018-12-14 DIAGNOSIS — L89302 Pressure ulcer of unspecified buttock, stage 2: Secondary | ICD-10-CM | POA: Diagnosis present

## 2018-12-14 HISTORY — DX: Major depressive disorder, single episode, unspecified: F32.9

## 2018-12-14 HISTORY — DX: Chest pain, unspecified: R07.9

## 2018-12-14 HISTORY — DX: Other ill-defined heart diseases: I51.89

## 2018-12-14 HISTORY — DX: End stage renal disease: N18.6

## 2018-12-14 HISTORY — DX: Type 2 diabetes mellitus without complications: E11.9

## 2018-12-14 HISTORY — DX: Hypothyroidism, unspecified: E03.9

## 2018-12-14 HISTORY — DX: Other hypotension: I95.89

## 2018-12-14 HISTORY — DX: Other chronic pain: G89.29

## 2018-12-14 HISTORY — DX: Hyperlipidemia, unspecified: E78.5

## 2018-12-14 HISTORY — DX: Anemia in other chronic diseases classified elsewhere: D63.8

## 2018-12-14 HISTORY — DX: Depression, unspecified: F32.A

## 2018-12-14 HISTORY — DX: Obesity, unspecified: E66.9

## 2018-12-14 HISTORY — DX: Pain in right hip: M25.551

## 2018-12-14 LAB — CBC WITH DIFFERENTIAL/PLATELET
Abs Immature Granulocytes: 0.14 10*3/uL — ABNORMAL HIGH (ref 0.00–0.07)
Basophils Absolute: 0 10*3/uL (ref 0.0–0.1)
Basophils Relative: 0 %
Eosinophils Absolute: 0 10*3/uL (ref 0.0–0.5)
Eosinophils Relative: 0 %
HCT: 28.3 % — ABNORMAL LOW (ref 36.0–46.0)
Hemoglobin: 8.5 g/dL — ABNORMAL LOW (ref 12.0–15.0)
Immature Granulocytes: 1 %
LYMPHS PCT: 11 %
Lymphs Abs: 1.1 10*3/uL (ref 0.7–4.0)
MCH: 26.8 pg (ref 26.0–34.0)
MCHC: 30 g/dL (ref 30.0–36.0)
MCV: 89.3 fL (ref 80.0–100.0)
Monocytes Absolute: 0.4 10*3/uL (ref 0.1–1.0)
Monocytes Relative: 4 %
Neutro Abs: 8 10*3/uL — ABNORMAL HIGH (ref 1.7–7.7)
Neutrophils Relative %: 84 %
Platelets: 44 10*3/uL — ABNORMAL LOW (ref 150–400)
RBC: 3.17 MIL/uL — ABNORMAL LOW (ref 3.87–5.11)
RDW: 16.3 % — ABNORMAL HIGH (ref 11.5–15.5)
WBC: 9.7 10*3/uL (ref 4.0–10.5)
nRBC: 0.2 % (ref 0.0–0.2)

## 2018-12-14 LAB — BASIC METABOLIC PANEL
Anion gap: 19 — ABNORMAL HIGH (ref 5–15)
BUN: 135 mg/dL — ABNORMAL HIGH (ref 6–20)
CO2: 24 mmol/L (ref 22–32)
CREATININE: 10.86 mg/dL — AB (ref 0.44–1.00)
Calcium: 8.5 mg/dL — ABNORMAL LOW (ref 8.9–10.3)
Chloride: 97 mmol/L — ABNORMAL LOW (ref 98–111)
GFR calc non Af Amer: 4 mL/min — ABNORMAL LOW (ref >60)
GFR, EST AFRICAN AMERICAN: 4 mL/min — AB (ref >60)
Glucose, Bld: 114 mg/dL — ABNORMAL HIGH (ref 70–99)
Potassium: 4.1 mmol/L (ref 3.5–5.1)
Sodium: 140 mmol/L (ref 135–145)

## 2018-12-14 NOTE — ED Provider Notes (Signed)
[facility] Emergency Department Provider Note   ____________________________________________   I have reviewed the triage vital signs and the nursing notes.   HISTORY  Chief Complaint Vascular Access Problem   History limited by: Not Limited, some history obtained over telephone from Dr. Holley Raring   HPI Lindsey Russell is a 48 y.o. female who presents to the emergency department today brought in by EMS from [facility] healthcare by written order of Dr. Holley Raring with nephrology for admission for evaluation and placement of femeral catheter. The patient herself is unclear why she was sent to the emergency department. Per Dr. Holley Raring the patient has not had dialysis for the past 5 days. Does have a right upper chest catheter which is no longer working. Patient herself denies any medical problems at this time.   Per medical record review patient has a history of DM, ESRD on dialysis.   Past Medical History:  Diagnosis Date  . Diabetes mellitus without complication ([place])   . Hyperkalemia   . Renal disorder    end stage renal disease  . Sleep apnea   . Thyroid disease     There are no active problems to display for this patient.   Past Surgical History:  Procedure Laterality Date  . CESAREAN SECTION     x 2  . DIALYSIS/PERMA CATHETER INSERTION N/A 12/02/2018   Procedure: DIALYSIS/PERMA CATHETER INSERTION;  Surgeon: Algernon Huxley, MD;  Location: [facility];  Service: Cardiovascular;  Laterality: N/A;    Prior to Admission medications   Medication Sig Start Date End Date Taking? Authorizing Provider  buPROPion (WELLBUTRIN XL) 300 MG 24 hr tablet Take 300 mg by mouth daily.    Care, [facility]  cinacalcet (SENSIPAR) 30 MG tablet Take 30 mg by mouth daily with breakfast.    Care, [facility]  docusate sodium (COLACE) 100 MG capsule Take 100 mg by mouth daily.    Care, [facility]  FERRIC CITRATE PO Take 210 mg by mouth 3 (three) times daily.     Care, [facility]  HYDROcodone-acetaminophen (NORCO/VICODIN) 5-325 MG tablet Take 1 tablet by mouth every 6 (six) hours as needed for moderate pain.    Care, [facility]  levothyroxine (SYNTHROID, LEVOTHROID) 75 MCG tablet Take 75 mcg by mouth daily before breakfast.    Care, [facility]  midodrine (PROAMATINE) 5 MG tablet Take 5 mg by mouth 3 (three) times a week. EVERY TUES, TH, SAT. ON DIALYSIS DAYS    Care, [facility]  omeprazole (PRILOSEC) 20 MG capsule Take 20 mg by mouth daily. DELAYED RELEASE    Care, [facility]  polyethylene glycol (MIRALAX / GLYCOLAX) packet Take 17 g by mouth daily as needed.    Care, [facility]  pregabalin (LYRICA) 100 MG capsule Take 100 mg by mouth 2 (two) times daily.    Care, [facility]  promethazine (PHENERGAN) 12.5 MG tablet Take 12.5 mg by mouth every 6 (six) hours as needed for nausea or vomiting.    Care, [facility]  sevelamer carbonate (RENVELA) 800 MG tablet Take 800 mg by mouth 2 (two) times daily. WITH MEALS    Care, [facility]    Allergies Morphine and related and Naproxen  No family history on file.  Social History Social History   Tobacco Use  . Smoking status: Never Smoker  . Smokeless tobacco: Never Used  Substance Use Topics  . Alcohol use: Never    Frequency: Never  . Drug use: Never  Review of Systems Constitutional: No fever/chills Eyes: No visual changes. ENT: No sore throat. Cardiovascular: Denies chest pain. Respiratory: Denies shortness of breath. Gastrointestinal: No abdominal pain.  No nausea, no vomiting.  No diarrhea.   Genitourinary: Negative for dysuria. Musculoskeletal: Negative for back pain. Skin: Negative for rash. Neurological: Negative for headaches, focal weakness or numbness.  ____________________________________________   PHYSICAL EXAM:  VITAL SIGNS: ED Triage Vitals  Enc Vitals Group     BP 12/16/2018 1944 (!) 84/68     Pulse Rate 12/24/2018 1944 92     Resp  --      Temp 12/16/2018 1944 (!) 97.1 F (36.2 C)     Temp Source 11/29/2018 1944 Axillary     SpO2 12/19/2018 1944 98 %     Weight 12/15/2018 1947 (!) 370 lb (167.8 kg)     Height 12/01/2018 1947 5\' 6"  (1.676 m)     Head Circumference --      Peak Flow --      Pain Score 12/27/2018 1946 10   Constitutional: Alert and oriented.  Eyes: Conjunctivae are normal.  ENT      Head: Normocephalic and atraumatic.      Nose: No congestion/rhinnorhea.      Mouth/Throat: Mucous membranes are moist.      Neck: No stridor. Hematological/Lymphatic/Immunilogical: No cervical lymphadenopathy. Cardiovascular: Normal rate, regular rhythm.  No murmurs, rubs, or gallops. Catheter in place in left chest.  Respiratory: Normal respiratory effort without tachypnea nor retractions. Breath sounds are clear and equal bilaterally. No wheezes/rales/rhonchi. Gastrointestinal: Soft and non tender. No rebound. No guarding.  Genitourinary: Deferred Musculoskeletal: Normal range of motion in all extremities. No lower extremity edema. Neurologic:  Normal speech and language. No gross focal neurologic deficits are appreciated.  Skin:  Skin is warm, dry and intact. No rash noted. Psychiatric: Mood and affect are normal. Speech and behavior are normal. Patient exhibits appropriate insight and judgment.  ____________________________________________    LABS (pertinent positives/negatives)  CBC wbc 9.7, hgb 8.5, plt 44 BMP na 140, k 4.1, cr 10.86  ___________________________________________    RADIOLOGY  None  ____________________________________________   PROCEDURES  Procedures  ____________________________________________   INITIAL IMPRESSION / ASSESSMENT AND PLAN / ED COURSE  Pertinent labs & imaging results that were available during my care of the patient were reviewed by me and considered in my medical decision making (see chart for details).   Patient sent to emergency department by nephrologist for  admission to help establish access for dialysis. On exam patient awake and alert. Will plan on admission.   ____________________________________________   FINAL CLINICAL IMPRESSION(S) / ED DIAGNOSES  Final diagnoses:  Problem with vascular access     Note: This dictation was prepared with Dragon dictation. Any transcriptional errors that result from this process are unintentional    Nance Pear, MD 12/15/18 [phone]

## 2018-12-14 NOTE — ED Notes (Signed)
Pt resting in bed. Call bell within reach.

## 2018-12-14 NOTE — ED Notes (Signed)
ED TO INPATIENT HANDOFF REPORT  ED Nurse Name and Phone #: Metta Clines [phone]  S Name/Age/Gender Lindsey Russell 48 y.o. female Room/Bed: ED14A/ED14A  Code Status   Code Status: Not on file  Home/SNF/Other [place] Patient oriented to: self, place, time and situation Is this baseline? Yes   Triage Complete: Triage complete  Chief Complaint cath replaced ems  Triage Note Pt in via EMS from [facility] d/t need for femoral cath placement for dialysis. VSS. Pt's current access potentially clotted for about a week.    Allergies Allergies  Allergen Reactions  . Morphine And Related   . Naproxen     Level of Care/Admitting Diagnosis ED Disposition    ED Disposition Condition Comment   Admit  The patient appears reasonably stabilized for admission considering the current resources, flow, and capabilities available in the ED at this time, and I doubt any other [facility] requiring further screening and/or treatment in the ED prior to admission is  present.       B Medical/Surgery History Past Medical History:  Diagnosis Date  . Diabetes mellitus without complication ([place])   . Hyperkalemia   . Renal disorder    end stage renal disease  . Sleep apnea   . Thyroid disease    Past Surgical History:  Procedure Laterality Date  . CESAREAN SECTION     x 2  . DIALYSIS/PERMA CATHETER INSERTION N/A 12/02/2018   Procedure: DIALYSIS/PERMA CATHETER INSERTION;  Surgeon: Algernon Huxley, MD;  Location: [place] CV LAB;  Service: Cardiovascular;  Laterality: N/A;     A IV Location/Drains/Wounds Patient Lines/Drains/Airways Status   Active Line/Drains/Airways    Name:   Placement date:   Placement time:   Site:   Days:   Peripheral IV 12/22/2018 Right Antecubital   12/15/2018    2011    Antecubital   less than 1          Intake/Output Last 24 hours No intake or output data in the 24 hours ending 12/07/2018 2124  Labs/Imaging Results for orders placed  or performed during the hospital encounter of 11/29/2018 (from the past 48 hour(s))  CBC with Differential     Status: Abnormal (Preliminary result)   Collection Time: 12/05/2018  8:17 PM  Result Value Ref Range   WBC 9.7 4.0 - 10.5 K/uL   RBC 3.17 (L) 3.87 - 5.11 MIL/uL   Hemoglobin 8.5 (L) 12.0 - 15.0 g/dL   HCT 28.3 (L) 36.0 - 46.0 %   MCV 89.3 80.0 - 100.0 fL   MCH 26.8 26.0 - 34.0 pg   MCHC 30.0 30.0 - 36.0 g/dL   RDW 16.3 (H) 11.5 - 15.5 %   Platelets PENDING 150 - 400 K/uL   nRBC 0.2 0.0 - 0.2 %    Comment: Performed at [facility], [address] [place], [place] [address]   Neutrophils Relative % PENDING %   Neutro Abs PENDING 1.7 - 7.7 K/uL   Band Neutrophils PENDING %   Lymphocytes Relative PENDING %   Lymphs Abs PENDING 0.7 - 4.0 K/uL   Monocytes Relative PENDING %   Monocytes Absolute PENDING 0.1 - 1.0 K/uL   Eosinophils Relative PENDING %   Eosinophils Absolute PENDING 0.0 - 0.5 K/uL   Basophils Relative PENDING %   Basophils Absolute PENDING 0.0 - 0.1 K/uL   WBC Morphology PENDING    RBC Morphology PENDING    Smear Review PENDING    Other PENDING %  nRBC PENDING 0 /100 WBC   Metamyelocytes Relative PENDING %   Myelocytes PENDING %   Promyelocytes Relative PENDING %   Blasts PENDING %  Basic metabolic panel     Status: Abnormal   Collection Time: 12/09/2018  8:17 PM  Result Value Ref Range   Sodium 140 135 - 145 mmol/L   Potassium 4.1 3.5 - 5.1 mmol/L   Chloride 97 (L) 98 - 111 mmol/L   CO2 24 22 - 32 mmol/L   Glucose, Bld 114 (H) 70 - 99 mg/dL   BUN 135 (H) 6 - 20 mg/dL    Comment: RESULT CONFIRMED BY MANUAL DILUTION SMA   Creatinine, Ser 10.86 (H) 0.44 - 1.00 mg/dL   Calcium 8.5 (L) 8.9 - 10.3 mg/dL   GFR calc non Af Amer 4 (L) >60 mL/min   GFR calc Af Amer 4 (L) >60 mL/min   Anion gap 19 (H) 5 - 15    Comment: Performed at [facility], [address] [place], [place] [address]   No results found.  Pending Labs Unresulted Labs  (From admission, onward)   None      Vitals/Pain Today's Vitals   12/24/2018 1944 12/22/2018 1946 12/05/2018 1947 12/11/2018 2000  BP: (!) 84/68   100/69  Pulse: 93   88  Resp:    (!) 22  Temp: (!) 97.1 F (36.2 C)     TempSrc: Axillary     SpO2: 99%   98%  Weight:   (!) 167.8 kg   Height:   5\' 6"  (1.676 m)   PainSc:  10-Worst pain ever      Isolation Precautions No active isolations  Medications Medications - No data to display  Mobility non-ambulatory Moderate fall risk   Focused Assessments BP low to WDL; missed dialysis for a week d/t clot; HD cath L side of chest; L fa old access; sclera yellow   R Recommendations: See Admitting Provider Note  Report given to:   Additional Notes:  R ac 22g IV access; pt c/o abdominal ache.

## 2018-12-14 NOTE — ED Notes (Signed)
MD Marcille Blanco to see pt at bedside soon. No new orders.

## 2018-12-14 NOTE — ED Notes (Signed)
EKG completed

## 2018-12-14 NOTE — ED Triage Notes (Signed)
Pt in via EMS from [facility] d/t need for femoral cath placement for dialysis. VSS. Pt's current access potentially clotted for about a week.

## 2018-12-14 NOTE — Telephone Encounter (Signed)
I received a call from Lindsey Russell at [facility] regarding the patient. I returned the call and she stated that the dialysis center told her not to bring the patient to dialysis, I asked why and Lindsey Russell stated the patient was to have a procedure with [place]. 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 I also let her know that was discussed on Friday as well with Lindsey Russell at [facility] on [address].

## 2018-12-14 NOTE — ED Notes (Signed)
This RN attempting for IV access now. Pt hard stick.

## 2018-12-14 NOTE — ED Notes (Signed)
Pt's daughter Maryelizabeth Kaufmann called at [phone] per pt request. Pt wished to speak with daughter and asked that RN update her. Daughter states she just got off phone with pt. Gave brief update per pt request.

## 2018-12-14 NOTE — ED Notes (Signed)
Diamond MD notified of low BP.

## 2018-12-14 NOTE — ED Notes (Addendum)
Pt states her BP normally runs low (36C-38P systolic per pt). EDP Quentin Cornwall notified in person of low BP reading.

## 2018-12-15 ENCOUNTER — Other Ambulatory Visit (INDEPENDENT_AMBULATORY_CARE_PROVIDER_SITE_OTHER): Payer: Self-pay | Admitting: Vascular Surgery

## 2018-12-15 ENCOUNTER — Inpatient Hospital Stay: Payer: Medicare Other

## 2018-12-15 LAB — CBC WITH DIFFERENTIAL/PLATELET
ABS IMMATURE GRANULOCYTES: 0.09 10*3/uL — AB (ref 0.00–0.07)
Basophils Absolute: 0 10*3/uL (ref 0.0–0.1)
Basophils Relative: 0 %
Eosinophils Absolute: 0 10*3/uL (ref 0.0–0.5)
Eosinophils Relative: 1 %
HCT: 25.6 % — ABNORMAL LOW (ref 36.0–46.0)
HEMOGLOBIN: 7.8 g/dL — AB (ref 12.0–15.0)
Immature Granulocytes: 2 %
Lymphocytes Relative: 18 %
Lymphs Abs: 1.1 10*3/uL (ref 0.7–4.0)
MCH: 27.3 pg (ref 26.0–34.0)
MCHC: 30.5 g/dL (ref 30.0–36.0)
MCV: 89.5 fL (ref 80.0–100.0)
Monocytes Absolute: 0.4 10*3/uL (ref 0.1–1.0)
Monocytes Relative: 6 %
Neutro Abs: 4.3 10*3/uL (ref 1.7–7.7)
Neutrophils Relative %: 73 %
Platelets: 37 10*3/uL — ABNORMAL LOW (ref 150–400)
RBC: 2.86 MIL/uL — ABNORMAL LOW (ref 3.87–5.11)
RDW: 16.4 % — ABNORMAL HIGH (ref 11.5–15.5)
Smear Review: DECREASED
WBC: 5.8 10*3/uL (ref 4.0–10.5)
nRBC: 0 % (ref 0.0–0.2)

## 2018-12-15 LAB — HEMOGLOBIN A1C
Hgb A1c MFr Bld: 5.3 % (ref 4.8–5.6)
Mean Plasma Glucose: 105.41 mg/dL

## 2018-12-15 LAB — TSH: TSH: 15.54 u[IU]/mL — ABNORMAL HIGH (ref 0.350–4.500)

## 2018-12-15 LAB — PATHOLOGIST SMEAR REVIEW

## 2018-12-15 MED ORDER — BUPROPION HCL ER (XL) 300 MG PO TB24
300.0000 mg | ORAL_TABLET | Freq: Every day | ORAL | Status: DC
  Administered 2018-12-15: 300 mg via ORAL
  Filled 2018-12-15: qty 2
  Filled 2018-12-15 (×5): qty 1
  Filled 2018-12-15: qty 2
  Filled 2018-12-15: qty 1

## 2018-12-15 MED ORDER — HEPARIN SODIUM (PORCINE) 5000 UNIT/ML IJ SOLN: 5000.0000 [IU] | Freq: Three times a day (TID) | INTRAMUSCULAR | Status: DC

## 2018-12-15 MED ORDER — POLYETHYLENE GLYCOL 3350 17 G PO PACK: 17.0000 g | PACK | Freq: Every day | ORAL | Status: DC | PRN

## 2018-12-15 MED ORDER — DOCUSATE SODIUM 100 MG PO CAPS: 100.0000 mg | ORAL_CAPSULE | Freq: Every day | ORAL | Status: DC

## 2018-12-15 MED ORDER — PANTOPRAZOLE SODIUM 40 MG PO TBEC
40.0000 mg | DELAYED_RELEASE_TABLET | Freq: Every day | ORAL | Status: DC
  Administered 2018-12-15: 40 mg via ORAL
  Filled 2018-12-15 (×2): qty 1

## 2018-12-15 MED ORDER — ACETAMINOPHEN 650 MG RE SUPP: 650.0000 mg | Freq: Four times a day (QID) | RECTAL | Status: DC | PRN

## 2018-12-15 MED ORDER — ACETAMINOPHEN 325 MG PO TABS
650.0000 mg | ORAL_TABLET | Freq: Four times a day (QID) | ORAL | Status: DC | PRN
  Administered 2018-12-15 – 2018-12-17 (×2): 650 mg via ORAL
  Filled 2018-12-15 (×3): qty 2

## 2018-12-15 MED ORDER — PREGABALIN 50 MG PO CAPS
100.0000 mg | ORAL_CAPSULE | Freq: Two times a day (BID) | ORAL | Status: DC
  Administered 2018-12-15 (×2): 100 mg via ORAL
  Filled 2018-12-15 (×4): qty 2

## 2018-12-15 MED ORDER — MIDODRINE HCL 5 MG PO TABS: 5.0000 mg | ORAL_TABLET | ORAL | Status: DC

## 2018-12-15 MED ORDER — CINACALCET HCL 30 MG PO TABS
30.0000 mg | ORAL_TABLET | Freq: Every day | ORAL | Status: DC
  Filled 2018-12-15 (×8): qty 1

## 2018-12-15 MED ORDER — MIDODRINE HCL 5 MG PO TABS
5.0000 mg | ORAL_TABLET | Freq: Three times a day (TID) | ORAL | Status: DC
  Administered 2018-12-15 – 2018-12-20 (×6): 5 mg via ORAL
  Filled 2018-12-15 (×24): qty 1

## 2018-12-15 MED ORDER — SODIUM CHLORIDE 0.9 % IV SOLN
INTRAVENOUS | Status: DC | PRN
  Administered 2018-12-15: 10 mL via INTRAVENOUS
  Administered 2018-12-16: 250 mL via INTRAVENOUS
  Administered 2018-12-20: 500 mL via INTRAVENOUS

## 2018-12-15 MED ORDER — HYDROMORPHONE HCL 1 MG/ML IJ SOLN
0.5000 mg | Freq: Three times a day (TID) | INTRAMUSCULAR | Status: DC | PRN
  Administered 2018-12-15 – 2018-12-20 (×3): 0.5 mg via INTRAVENOUS
  Filled 2018-12-15: qty 0.5
  Filled 2018-12-15 (×2): qty 1

## 2018-12-15 MED ORDER — SEVELAMER CARBONATE 800 MG PO TABS
1600.0000 mg | ORAL_TABLET | Freq: Three times a day (TID) | ORAL | Status: DC
  Administered 2018-12-15: 1600 mg via ORAL
  Filled 2018-12-15 (×4): qty 2

## 2018-12-15 MED ORDER — FERRIC CITRATE 1 GM 210 MG(FE) PO TABS
210.0000 mg | ORAL_TABLET | Freq: Three times a day (TID) | ORAL | Status: DC
  Administered 2018-12-15 (×2): 210 mg via ORAL
  Filled 2018-12-15 (×6): qty 1

## 2018-12-15 MED ORDER — LEVOTHYROXINE SODIUM 50 MCG PO TABS
75.0000 ug | ORAL_TABLET | Freq: Every day | ORAL | Status: DC
  Administered 2018-12-15 – 2018-12-20 (×2): 75 ug via ORAL
  Filled 2018-12-15 (×3): qty 2

## 2018-12-15 MED ORDER — SODIUM CHLORIDE 0.9 % IV SOLN
1.0000 g | INTRAVENOUS | Status: DC
  Administered 2018-12-15: 1 g via INTRAVENOUS
  Filled 2018-12-15: qty 10
  Filled 2018-12-15: qty 1

## 2018-12-15 MED ORDER — VANCOMYCIN HCL 10 G IV SOLR
2000.0000 mg | Freq: Once | INTRAVENOUS | Status: AC
  Administered 2018-12-15: 2000 mg via INTRAVENOUS
  Filled 2018-12-15: qty 2000

## 2018-12-15 MED ORDER — ONDANSETRON HCL 4 MG/2ML IJ SOLN
4.0000 mg | Freq: Four times a day (QID) | INTRAMUSCULAR | Status: DC | PRN
  Administered 2018-12-15: 4 mg via INTRAVENOUS
  Filled 2018-12-15: qty 2

## 2018-12-15 MED ORDER — VANCOMYCIN VARIABLE DOSE PER UNSTABLE RENAL FUNCTION (PHARMACIST DOSING): Status: DC

## 2018-12-15 MED ORDER — MECLIZINE HCL 25 MG PO TABS
25.0000 mg | ORAL_TABLET | Freq: Two times a day (BID) | ORAL | Status: DC | PRN
  Filled 2018-12-15: qty 1

## 2018-12-15 MED ORDER — ONDANSETRON HCL 4 MG PO TABS: 4.0000 mg | ORAL_TABLET | Freq: Four times a day (QID) | ORAL | Status: DC | PRN

## 2018-12-15 MED ORDER — CEFAZOLIN SODIUM-DEXTROSE 1-4 GM/50ML-% IV SOLN
1.0000 g | INTRAVENOUS | Status: DC
  Filled 2018-12-15: qty 50

## 2018-12-15 MED ORDER — HYDROCODONE-ACETAMINOPHEN 5-325 MG PO TABS
1.0000 | ORAL_TABLET | Freq: Four times a day (QID) | ORAL | Status: DC | PRN
  Administered 2018-12-15 – 2018-12-16 (×4): 1 via ORAL
  Filled 2018-12-15 (×5): qty 1

## 2018-12-15 NOTE — Progress Notes (Signed)
The patient was turned and repositioned Q2. No falls. Bariatric air mattress was order. WOCN has seen moister associated dermitis on the patients thighs, sacrum and recommendations have been ordered. BP has been soft yet Midodrine has been started three times per day which has helped improve blood pressure. IV antibiotics. Femoral catheter to be place tomorrow Specials had emergency cases that did not allow them to fit the patient in their schedule.

## 2018-12-15 NOTE — Progress Notes (Signed)
MD messaged as well as vascular: Do you think you will be able to perform femoral vascular cath placement today. I think based on the notes the patient has not had dialysis since a week( tuesday of last week).

## 2018-12-15 NOTE — Consult Note (Signed)
[place] Nurse wound consult note Reason for Consult: wounds Wound type: MASD (moisture associated skin damage), IAD (incontinence associated dermatitis It requires 3-4 staff members to turn this patient.  Pressure Injury POA: NA Measurement: scattered areas of skin loss over the buttocks, posterior thighs, mons, perineum, inner thighs. Largest aprox 0.5cm x 9cm (linear along skin of inner left thigh)  Wound bed:all areas are clean, pink, moist, partial thickness  Drainage (amount, consistency, odor) unable to assess, patient with large liquid stool Periwound: intact  Noted other skin folds assessed. No areas of concern under her pannus, breast or axilla  Dressing procedure/placement/frequency:  DC use of purewick, patient is incontinent of stool  Order bariatric bed for moisture management, more space to turn patient, and pressure redistribution Moisture barrier cream to affected areas  May need flexiseal if incontinence of stool is a frequent occurrence.   Discussed POC with bedside nurse.  Re consult if needed, will not follow at this time. Thanks  Rebekha Diveley [employer], RN,CWOCN, CNS, [place] [phone])

## 2018-12-15 NOTE — Care Management (Signed)
Amanda Morris dialysis liaison notified of admission.    

## 2018-12-15 NOTE — NC FL2 (Signed)
[place] LEVEL OF CARE SCREENING TOOL     IDENTIFICATION  Patient Name: Lindsey Russell Birthdate: 07-24-1971 Sex: female Admission Date (Current Location): 12/22/2018  [facility] and [place] Number:  Engineering geologist and Address:  [facility], [address] [place],  [address]      Provider Number: [id number]  Attending Physician Name and Address:  Loletha Grayer, MD  Relative Name and Phone Number:       Current Level of Care: Hospital Recommended Level of Care: [place] Prior Approval Number:    Date Approved/Denied:   PASRR Number:    Discharge Plan: SNF    Current Diagnoses: Patient Active Problem List   Diagnosis Date Noted  . ESRD (end stage renal disease) on dialysis ([place]) 12/16/2018    Orientation RESPIRATION BLADDER Height & Weight     Self, Time, Situation, Place  Normal Continent Weight: (!) 370 lb (167.8 kg) Height:  5\' 6"  (167.6 cm)  BEHAVIORAL SYMPTOMS/MOOD NEUROLOGICAL BOWEL NUTRITION STATUS  (none) (none) Incontinent    AMBULATORY STATUS COMMUNICATION OF NEEDS Skin   Extensive Assist Verbally (contact dermatitis)                       Personal Care Assistance Level of Assistance  Dressing, Feeding, Bathing Bathing Assistance: Maximum assistance Feeding assistance: Maximum assistance Dressing Assistance: Maximum assistance     Functional Limitations Info  (none)          SPECIAL CARE FACTORS FREQUENCY  PT (By licensed PT)(hemodialysis)                    Contractures Contractures Info: Not present    Additional Factors Info  Code Status Code Status Info: full             Current Medications (12/15/2018):  This is the current hospital active medication list Current Facility-Administered Medications  Medication Dose Route Frequency Provider Last Rate Last Dose  . acetaminophen (TYLENOL) tablet 650 mg  650 mg Oral Q6H PRN Harrie Foreman, MD   650 mg  at 12/15/18 1244   Or  . acetaminophen (TYLENOL) suppository 650 mg  650 mg Rectal Q6H PRN Harrie Foreman, MD      . buPROPion (WELLBUTRIN XL) 24 hr tablet 300 mg  300 mg Oral Daily Harrie Foreman, MD   300 mg at 12/15/18 0100  . ceFAZolin (ANCEF) IVPB 1 g/50 mL premix  1 g Intravenous On Call Stegmayer, Kimberly A, PA-C      . cefTRIAXone (ROCEPHIN) 1 g in sodium chloride 0.9 % 100 mL IVPB  1 g Intravenous Q24H Wieting, Richard, MD      . cinacalcet (SENSIPAR) tablet 30 mg  30 mg Oral Q breakfast Harrie Foreman, MD      . docusate sodium (COLACE) capsule 100 mg  100 mg Oral Daily Harrie Foreman, MD      . ferric citrate (AURYXIA) tablet 210 mg  210 mg Oral TID Harrie Foreman, MD   Stopped at 12/15/18 1138  . HYDROcodone-acetaminophen (NORCO/VICODIN) 5-325 MG per tablet 1 tablet  1 tablet Oral Q6H PRN Harrie Foreman, MD   1 tablet at 12/15/18 1244  . HYDROmorphone (DILAUDID) injection 0.5 mg  0.5 mg Intravenous Q8H PRN Wieting, Richard, MD      . levothyroxine (SYNTHROID, LEVOTHROID) tablet 75 mcg  75 mcg Oral QAC breakfast Harrie Foreman, MD   75 mcg at  12/15/18 1135  . meclizine (ANTIVERT) tablet 25 mg  25 mg Oral BID PRN Harrie Foreman, MD      . midodrine (PROAMATINE) tablet 5 mg  5 mg Oral TID WC Loletha Grayer, MD   5 mg at 12/15/18 1135  . ondansetron (ZOFRAN) tablet 4 mg  4 mg Oral Q6H PRN Harrie Foreman, MD       Or  . ondansetron [facility]) injection 4 mg  4 mg Intravenous Q6H PRN Harrie Foreman, MD   4 mg at 12/15/18 1256  . pantoprazole (PROTONIX) EC tablet 40 mg  40 mg Oral Daily Harrie Foreman, MD   40 mg at 12/15/18 1136  . polyethylene glycol (MIRALAX / GLYCOLAX) packet 17 g  17 g Oral Daily PRN Harrie Foreman, MD      . pregabalin (LYRICA) capsule 100 mg  100 mg Oral BID Harrie Foreman, MD   100 mg at 12/15/18 1136  . sevelamer carbonate (RENVELA) tablet 1,600 mg  1,600 mg Oral TID WC Harrie Foreman, MD         Discharge  Medications: Please see discharge summary for a list of discharge medications.  Relevant Imaging Results:  Relevant Lab Results:   Additional Information ss: [id number]  Shela Leff, LCSW

## 2018-12-15 NOTE — TOC Initial Note (Signed)
Transition of Care [facility]) - Initial/Assessment Note    Patient Details  Name: Lindsey Russell MRN: [id number] Date of Birth: [birth year]  [facility] CM/SW Contact:    Shela Leff, LCSW Phone Number: 12/15/2018, 3:22 PM  Clinical Narrative:            CSW informed by patient's nurse that patient was wanting another facility if possible. Patient came from [facility] and has been there for short term rehab for 23 days. [facility] can take her bact at discharge.   CSW spoke with patient at bedside this afternoon. Previous report by nurse aid stated that patient was on her cell phone talking all morning and not wanting to have patient care because she did not want to stop talking on the phone. When CSW went to speak with her, patient was mumbling her words and as the conversation went on, her speech became clearer. Patient stated she would like to go to another facility but would not explain the reason why. CSW explained that a new bed search would be conducted but that if there was no one that could take her, then she would need to return to [facility]. Patient vocalized understanding.      Barriers to Discharge: Continued Medical Work up   Patient Goals and CMS Choice   CMS Medicare.gov Compare Post Acute Care list provided to:: Patient    Expected Discharge Plan and Services       Living arrangements for the past 2 months: [place] Expected Discharge Date: 12/18/18                        Prior Living Arrangements/Services Living arrangements for the past 2 months: [place] Lives with:: Facility Resident Patient language and need for interpreter reviewed:: No Do you feel safe going back to the place where you live?: Yes      Need for Family Participation in Patient Care: No (Comment)     Criminal Activity/Legal Involvement Pertinent to Current Situation/Hospitalization: No - Comment as needed  Activities of Daily  Living Home Assistive Devices/Equipment: Other (Comment) ADL Screening (condition at time of admission) Patient's cognitive ability adequate to safely complete daily activities?: Yes Is the patient deaf or have difficulty hearing?: No Does the patient have difficulty seeing, even when wearing glasses/contacts?: No Does the patient have difficulty concentrating, remembering, or making decisions?: No Patient able to express need for assistance with ADLs?: Yes Does the patient have difficulty dressing or bathing?: Yes Independently performs ADLs?: No Communication: Dependent Dressing (OT): Dependent Is this a change from baseline?: Pre-admission baseline Grooming: Dependent Is this a change from baseline?: Pre-admission baseline Feeding: Dependent Is this a change from baseline?: Pre-admission baseline Bathing: Dependent Is this a change from baseline?: Pre-admission baseline Toileting: Dependent Is this a change from baseline?: Pre-admission baseline In/Out Bed: Dependent Is this a change from baseline?: Pre-admission baseline Does the patient have difficulty walking or climbing stairs?: No Weakness of Legs: Both Weakness of Arms/Hands: Both  Permission Sought/Granted Permission sought to share information with : Chartered certified accountant granted to share information with : Yes, Verbal Permission Granted  Share Information with NAME: Maryelizabeth Kaufmann  Permission granted to share info w AGENCY: [place] granted to share info w Relationship: Daughter     Emotional Assessment Appearance:: Appears older than stated age Attitude/Demeanor/Rapport: Self-Absorbed Affect (typically observed): Flat, Calm, Tearful/Crying Orientation: : Oriented to Self, Oriented to  Place, Oriented to Situation, Oriented to  Time Alcohol / Substance Use: Not Applicable Psych Involvement: No (comment)  Admission diagnosis:  Problem with vascular  access [Z78.9] ESRD (end stage renal disease) on dialysis ([place]) [N18.6, Z99.2] Patient Active Problem List   Diagnosis Date Noted  . ESRD (end stage renal disease) on dialysis ([place]) 12/24/2018   PCP:  Marco Collie, MD Pharmacy:   [facility], [address] [place] [address] Phone: [phone] Fax: [phone]     Social Determinants of Health (SDOH) Interventions    Readmission Risk Interventions 30 Day Unplanned Readmission Risk Score     ED to Hosp-Admission (Current) from 12/29/2018 in [place]  30 Day Unplanned Readmission Risk Score (%)  17 Filed at 12/15/2018 1200     This score is the patient's risk of an unplanned readmission within 30 days of being discharged (0 -100%). The score is based on dignosis, age, lab data, medications, orders, and past utilization.   Low:  0-14.9   Medium: 15-21.9   High: 22-29.9   Extreme: 30 and above       No flowsheet data found.

## 2018-12-15 NOTE — Progress Notes (Addendum)
Paged MD: Can you order some IV pain medication even it its a one time dose of pain medicine as he continues to complain of pain. Her husband spoke to me on the phone and indicated she cries when she has pain but does not say anything about her pain. Lastly Her IV gave out and the IV team came to place a new IV. The new IV was placed on the L lower extremity where she has a fistula site that has No bruit or thrill noted. Per patient has not worked or been used.

## 2018-12-15 NOTE — Progress Notes (Signed)
Pharmacy Antibiotic Note  Lindsey Russell is a 48 y.o. female admitted on 12/27/2018 with bacteremia.  Pharmacy has been consulted for vancomycin dosing.  Plan: Vancomycin dosing for Hemodialysis Vancomycin Loading Dose = 2000mg  (based on dosing weight of 85.6) Maintenance Dose : 1000mg  after each dialysis session   Height: 5\' 6"  (167.6 cm) Weight: (!) 370 lb (167.8 kg) IBW/kg (Calculated) : 59.3  Temp (24hrs), Avg:97.9 F (36.6 C), Min:97.1 F (36.2 C), Max:98.8 F (37.1 C)  Recent Labs  Lab 12/12/18 1219 12/28/2018 2017 12/15/18 0923  WBC 5.5 9.7 5.8  CREATININE 8.81* 10.86*  --     Estimated Creatinine Clearance: 10.2 mL/min (A) (by C-G formula based on SCr of 10.86 mg/dL (H)).    Allergies  Allergen Reactions  . Morphine And Related   . Naproxen     Antimicrobials this admission: Vancomycin  3/17 >>  Rocephin  3/17 >>    Microbiology results: 3/17 BCx:   Thank you for allowing pharmacy to be a part of this patient's care.  Bode Pieper 12/15/2018 1:59 PM

## 2018-12-15 NOTE — Progress Notes (Signed)
MD messaged: FYI, the patient has low blood pressures 88/59, HR 86.

## 2018-12-15 NOTE — H&P (Addendum)
Lindsey Russell is an 48 y.o. female.   Chief Complaint: Problem with vascular access HPI: The patient with past medical history of end-stage renal disease on dialysis as well as diabetes presents to the emergency department due to difficulty with vascular access.  Her previous access has reportedly been clotted for possibly 1 week.  Laboratory evaluation shows creatinine greater than 10 but potassium within normal limits.  In the emergency department there was some concern for low blood pressure but the patient reports that her average systolic pressures are usually between 80 and 90.  She denies pain or difficulty attending to information.  Once she was found to be stable in the emergency department the hospitalist service was called for admission.  Past Medical History:  Diagnosis Date  . Diabetes mellitus without complication ([place])   . Hyperkalemia   . Renal disorder    end stage renal disease  . Sleep apnea   . Thyroid disease     Past Surgical History:  Procedure Laterality Date  . CESAREAN SECTION     x 2  . DIALYSIS/PERMA CATHETER INSERTION N/A 12/02/2018   Procedure: DIALYSIS/PERMA CATHETER INSERTION;  Surgeon: Algernon Huxley, MD;  Location: [place] [facility];  Service: Cardiovascular;  Laterality: N/A;    History reviewed. No pertinent family history. diabetes Social History:  reports that she has never smoked. She has never used smokeless tobacco. She reports that she does not drink alcohol or use drugs.  Allergies:  Allergies  Allergen Reactions  . Morphine And Related   . Naproxen     Medications Prior to Admission  Medication Sig Dispense Refill  . buPROPion (WELLBUTRIN XL) 300 MG 24 hr tablet Take 300 mg by mouth daily.    . cinacalcet (SENSIPAR) 30 MG tablet Take 30 mg by mouth daily with breakfast.    . docusate sodium (COLACE) 100 MG capsule Take 100 mg by mouth daily.    [name] FERRIC CITRATE PO Take 210 mg by mouth 3 (three) times daily.    [name]  HYDROcodone-acetaminophen (NORCO/VICODIN) 5-325 MG tablet Take 1 tablet by mouth every 6 (six) hours as needed for moderate pain.    [name] levothyroxine (SYNTHROID, LEVOTHROID) 75 MCG tablet Take 75 mcg by mouth daily before breakfast.    . meclizine (ANTIVERT) 25 MG tablet Take 25 mg by mouth 2 (two) times daily as needed for dizziness.    . midodrine (PROAMATINE) 5 MG tablet Take 5 mg by mouth 3 (three) times a week. EVERY TUES, TH, SAT. ON DIALYSIS DAYS    . omeprazole (PRILOSEC) 20 MG capsule Take 20 mg by mouth daily. DELAYED RELEASE    . polyethylene glycol (MIRALAX / GLYCOLAX) packet Take 17 g by mouth daily as needed.    . pregabalin (LYRICA) 100 MG capsule Take 100 mg by mouth 2 (two) times daily.    . promethazine (PHENERGAN) 12.5 MG tablet Take 12.5 mg by mouth every 6 (six) hours as needed for nausea or vomiting.    . sevelamer carbonate (RENVELA) 800 MG tablet Take 1,600 mg by mouth 3 (three) times daily with meals. WITH MEALS       Results for orders placed or performed during the hospital encounter of 12/05/2018 (from the past 48 hour(s))  CBC with Differential     Status: Abnormal   Collection Time: 12/18/2018  8:17 PM  Result Value Ref Range   WBC 9.7 4.0 - 10.5 K/uL   RBC 3.17 (L) 3.87 - 5.11 MIL/uL  Hemoglobin 8.5 (L) 12.0 - 15.0 g/dL   HCT 28.3 (L) 36.0 - 46.0 %   MCV 89.3 80.0 - 100.0 fL   MCH 26.8 26.0 - 34.0 pg   MCHC 30.0 30.0 - 36.0 g/dL   RDW 16.3 (H) 11.5 - 15.5 %   Platelets 44 (L) 150 - 400 K/uL    Comment: Immature Platelet Fraction may be clinically indicated, consider ordering this additional test [id number]    nRBC 0.2 0.0 - 0.2 %   Neutrophils Relative % 84 %   Neutro Abs 8.0 (H) 1.7 - 7.7 K/uL   Lymphocytes Relative 11 %   Lymphs Abs 1.1 0.7 - 4.0 K/uL   Monocytes Relative 4 %   Monocytes Absolute 0.4 0.1 - 1.0 K/uL   Eosinophils Relative 0 %   Eosinophils Absolute 0.0 0.0 - 0.5 K/uL   Basophils Relative 0 %   Basophils Absolute 0.0 0.0 - 0.1 K/uL    WBC Morphology MORPHOLOGY UNREMARKABLE    RBC Morphology MORPHOLOGY UNREMARKABLE    Smear Review PLATELET COUNT CONFIRMED BY SMEAR    Immature Granulocytes 1 %   Abs Immature Granulocytes 0.14 (H) 0.00 - 0.07 K/uL    Comment: Performed at [facility], [address] [place], [place] [address]  Basic metabolic panel     Status: Abnormal   Collection Time: 12/05/2018  8:17 PM  Result Value Ref Range   Sodium 140 135 - 145 mmol/L   Potassium 4.1 3.5 - 5.1 mmol/L   Chloride 97 (L) 98 - 111 mmol/L   CO2 24 22 - 32 mmol/L   Glucose, Bld 114 (H) 70 - 99 mg/dL   BUN 135 (H) 6 - 20 mg/dL    Comment: RESULT CONFIRMED BY MANUAL DILUTION SMA   Creatinine, Ser 10.86 (H) 0.44 - 1.00 mg/dL   Calcium 8.5 (L) 8.9 - 10.3 mg/dL   GFR calc non Af Amer 4 (L) >60 mL/min   GFR calc Af Amer 4 (L) >60 mL/min   Anion gap 19 (H) 5 - 15    Comment: Performed at [facility], [address] [place], [place] [address]  TSH     Status: Abnormal   Collection Time: 12/05/2018  8:17 PM  Result Value Ref Range   TSH 15.540 (H) 0.350 - 4.500 uIU/mL    Comment: Performed by a 3rd Generation assay with a functional sensitivity of <=0.01 uIU/mL. Performed at [facility] [address] [place], [place] [address]    No results found.  Review of Systems  Constitutional: Negative for chills and fever.  HENT: Negative for sore throat and tinnitus.   Eyes: Negative for blurred vision and redness.  Respiratory: Negative for cough and shortness of breath.   Cardiovascular: Negative for chest pain, palpitations, orthopnea and PND.  Gastrointestinal: Negative for abdominal pain, diarrhea, nausea and vomiting.  Genitourinary: Negative for dysuria, frequency and urgency.  Musculoskeletal: Negative for joint pain and myalgias.  Skin: Negative for rash.       No lesions  Neurological: Negative for speech change, focal weakness and weakness.  Endo/Heme/Allergies: Does not bruise/bleed easily.        No temperature intolerance  Psychiatric/Behavioral: Negative for depression and suicidal ideas.    Blood pressure (!) 89/71, pulse 92, temperature 98.8 F (37.1 C), temperature source Oral, resp. rate (!) 24, height 5\' 6"  (1.676 m), weight (!) 167.8 kg, SpO2 98 %. Physical Exam  Vitals reviewed. Constitutional: She is oriented to person, place,  and time. She appears well-developed and well-nourished. No distress.  HENT:  Head: Normocephalic and atraumatic.  Mouth/Throat: Oropharynx is clear and moist.  Eyes: Pupils are equal, round, and reactive to light. Conjunctivae and EOM are normal. No scleral icterus.  Neck: Normal range of motion. Neck supple. No JVD present. No tracheal deviation present. No thyromegaly present.  Cardiovascular: Normal rate, regular rhythm and normal heart sounds. Exam reveals no gallop and no friction rub.  No murmur heard. Respiratory: Effort normal and breath sounds normal.  GI: Soft. Bowel sounds are normal. She exhibits no distension. There is no abdominal tenderness.  Genitourinary:    Genitourinary Comments: Deferred   Musculoskeletal: Normal range of motion.        General: No edema.  Lymphadenopathy:    She has no cervical adenopathy.  Neurological: She is alert and oriented to person, place, and time. No cranial nerve deficit. She exhibits normal muscle tone.  Skin: Skin is warm and dry. No rash noted. No erythema.  Psychiatric: She has a normal mood and affect. Her behavior is normal. Judgment and thought content normal.     Assessment/Plan This is a 48 year old female admitted for vascular access problem for ESRD. 1.  ESRD: On dialysis; the patient no longer has working vascular access.  Clotted catheters in chest.  We will consult vascular surgery for access placement femoral vein.  Continue Auryxia 2.  Hypothyroidism: Check TSH; continue Synthroid 3.  Hyperparathyroidism: Secondary to renal disease.  Continue Renvela and Sensipar 4.   Morbid obesity: BMI is 59.7; encouraged healthy diet and exercise 5.  DVT prophylaxis: SCDs 6.  GI prophylaxis: Pantoprazole per home regimen The patient is a full code.  Time spent on admission orders and patient care approximately 45 minutes  Harrie Foreman, MD 12/15/2018, 3:41 AM

## 2018-12-15 NOTE — Consult Note (Signed)
[facility] Consult Note  MRN : [id number]  Lindsey Russell is a 48 y.o. (Jan 21, 1971) female who presents with chief complaint of  Chief Complaint  Patient presents with  . Vascular Access Problem   History of Present Illness:  The patient is a 48 year old female with a past medical history of thyroid disease, sleep apnea, diabetes, morbid obesity, end-stage renal disease on chronic hemodialysis who presented to the [facility] with inability to dialyze due to dialysis access issues.  The patient is a poor historian.  She endorses the inability to dialyze through one of her PermCath ports.  Patient states that she has had multiple accesses placed in her upper and lower extremities.  Unsure if this is true.  As per epic notes, seems the patient has been unable to dialyze for about a week.  Potassium on 12/11/2018 is 4.1.  Patient denies any shortness of breath or chest pain.  Patient denies any fever, nausea vomiting.  Patient has a left forearm access.  This was not created by our service.  At this time, I do not feel or hear a bruit.  PermCath is intact to her left IJ.  On 12/02/18, the patient is s/p: 1. Ultrasound guidance for vascular access to the left internal jugular vein 2. Catheter placement into the SVC and right atrium from left jugular approach 3. Left jugular venogram and superior venacavogram 4. Percutaneous transluminal angioplasty of the left jugular and innominate veins with 8 mm diameter angioplasty balloon 5. Percutaneous transluminal angioplasty of the superior vena cava with 8 and 12 mm diameter angioplasty balloons 6. Fluoroscopic guidance for placement of catheter 7. Placement of a 33 cm tip to cuff tunneled hemodialysis catheter via the left internal jugular vein  Findings at the time of her most recent procedure was: 1.  Patent left internal jugular vein in the neck with occlusion at the clavicle.  There was  reconstitution of the superior vena cava just above the right atrium but multiple collaterals were present.  Vascular surgery was consulted by Dr. Marcille Blanco for dialysis access Current Facility-Administered Medications  Medication Dose Route Frequency Provider Last Rate Last Dose  . acetaminophen (TYLENOL) tablet 650 mg  650 mg Oral Q6H PRN Harrie Foreman, MD       Or  . acetaminophen (TYLENOL) suppository 650 mg  650 mg Rectal Q6H PRN Harrie Foreman, MD      . buPROPion (WELLBUTRIN XL) 24 hr tablet 300 mg  300 mg Oral Daily Harrie Foreman, MD      . ceFAZolin (ANCEF) IVPB 1 g/50 mL premix  1 g Intravenous On Call Taunia Frasco A, PA-C      . cinacalcet (SENSIPAR) tablet 30 mg  30 mg Oral Q breakfast Harrie Foreman, MD      . docusate sodium (COLACE) capsule 100 mg  100 mg Oral Daily Harrie Foreman, MD      . ferric citrate (AURYXIA) tablet 210 mg  210 mg Oral TID Harrie Foreman, MD      . HYDROcodone-acetaminophen (NORCO/VICODIN) 5-325 MG per tablet 1 tablet  1 tablet Oral Q6H PRN Harrie Foreman, MD      . levothyroxine (SYNTHROID, LEVOTHROID) tablet 75 mcg  75 mcg Oral QAC breakfast Harrie Foreman, MD      . meclizine (ANTIVERT) tablet 25 mg  25 mg Oral BID PRN Harrie Foreman, MD      . midodrine (PROAMATINE) tablet 5  mg  5 mg Oral TID WC Wieting, Richard, MD      . ondansetron [facility]) tablet 4 mg  4 mg Oral Q6H PRN Harrie Foreman, MD       Or  . ondansetron [facility]) injection 4 mg  4 mg Intravenous Q6H PRN Harrie Foreman, MD      . pantoprazole (PROTONIX) EC tablet 40 mg  40 mg Oral Daily Harrie Foreman, MD      . polyethylene glycol (MIRALAX / GLYCOLAX) packet 17 g  17 g Oral Daily PRN Harrie Foreman, MD      . pregabalin (LYRICA) capsule 100 mg  100 mg Oral BID Harrie Foreman, MD      . sevelamer carbonate (RENVELA) tablet 1,600 mg  1,600 mg Oral TID WC Harrie Foreman, MD       Past Medical History:  Diagnosis Date  .  Diabetes mellitus without complication ([place])   . Hyperkalemia   . Renal disorder    end stage renal disease  . Sleep apnea   . Thyroid disease    Past Surgical History:  Procedure Laterality Date  . CESAREAN SECTION     x 2  . DIALYSIS/PERMA CATHETER INSERTION N/A 12/02/2018   Procedure: DIALYSIS/PERMA CATHETER INSERTION;  Surgeon: Algernon Huxley, MD;  Location: [place] [facility];  Service: Cardiovascular;  Laterality: N/A;   Social History Social History   Tobacco Use  . Smoking status: Never Smoker  . Smokeless tobacco: Never Used  Substance Use Topics  . Alcohol use: Never    Frequency: Never  . Drug use: Never   Family History History reviewed. No pertinent family history.  Patient denies any family history of peripheral artery disease, venous disease or renal disease.  Allergies  Allergen Reactions  . Morphine And Related   . Naproxen    REVIEW OF SYSTEMS (Negative unless checked)  Constitutional: [] Weight loss  [] Fever  [] Chills Cardiac: [] Chest pain   [] Chest pressure   [] Palpitations   [] Shortness of breath when laying flat   [] Shortness of breath at rest   [] Shortness of breath with exertion. Vascular:  [] Pain in legs with walking   [] Pain in legs at rest   [] Pain in legs when laying flat   [] Claudication   [] Pain in feet when walking  [] Pain in feet at rest  [] Pain in feet when laying flat   [] History of DVT   [] Phlebitis   [] Swelling in legs   [] Varicose veins   [] Non-healing ulcers Pulmonary:   [] Uses home oxygen   [] Productive cough   [] Hemoptysis   [] Wheeze  [] COPD   [] Asthma Neurologic:  [] Dizziness  [] Blackouts   [] Seizures   [] History of stroke   [] History of TIA  [] Aphasia   [] Temporary blindness   [] Dysphagia   [] Weakness or numbness in arms   [] Weakness or numbness in legs Musculoskeletal:  [] Arthritis   [] Joint swelling   [] Joint pain   [] Low back pain Hematologic:  [] Easy bruising  [] Easy bleeding   [] Hypercoagulable state   [x] Anemic   [] Hepatitis Gastrointestinal:  [] Blood in stool   [] Vomiting blood  [] Gastroesophageal reflux/heartburn   [] Difficulty swallowing. Genitourinary:  [x] Chronic kidney disease   [] Difficult urination  [] Frequent urination  [] Burning with urination   [] Blood in urine Skin:  [] Rashes   [] Ulcers   [] Wounds Psychological:  [] History of anxiety   []  History of major depression.  (+) Morbid Obesity  Physical Examination  Vitals:   12/16/2018 2344 12/26/2018  2346 12/15/18 0051 12/15/18 0736  BP: (!) 81/32 (!) 81/32 (!) 89/71 (!) 88/59  Pulse: 88  92 86  Resp: 16  (!) 24 20  Temp:   98.8 F (37.1 C) 98.2 F (36.8 C)  TempSrc:   Oral Oral  SpO2: 98%  98% 97%  Weight:      Height:       Body mass index is 59.72 kg/m. Gen:  WD/WN, NAD, Morbidly Obese Head: [place]/AT, No temporalis wasting. Prominent temp pulse not noted. Ear/Nose/Throat: Hearing grossly intact, nares w/o erythema or drainage, oropharynx w/o Erythema/Exudate Eyes: Sclera non-icteric, conjunctiva clear Neck: Trachea midline.  No JVD.  Pulmonary:  Good air movement, respirations not labored, equal bilaterally.  Cardiac: RRR, normal S1, S2. Vascular:  Vessel Right Left  Radial Palpable Palpable  Ulnar Palpable Palpable  Brachial Palpable Palpable  Carotid Palpable, without bruit Palpable, without bruit  Aorta Not palpable N/A  Femoral    Popliteal    PT    DP     Vascular:  Unable to palpate lower extremity pulses due to body habitus but extremities are warm.  Left IJ permcath is intact. No signs of infection noted Left Upper Extremity: Can palpate what feel like a graft tunnelled from antecubital fossa toward radial artery. No bruit or thrill noted.   Gastrointestinal: soft, non-tender/non-distended. No guarding/reflex.  Musculoskeletal: M/S 5/5 throughout.  Extremities without ischemic changes.  No deformity or atrophy. Edema noted bilaterally Neurologic: Sensation grossly intact in extremities.  Symmetrical.  Speech is  fluent. Motor exam as listed above. Psychiatric: Judgment intact, Mood & affect appropriate for pt's clinical situation. Dermatologic: No rashes or ulcers noted.  No cellulitis or open wounds. Lymph : No Cervical, Axillary, or Inguinal lymphadenopathy.  CBC Lab Results  Component Value Date   WBC 5.8 12/15/2018   HGB 7.8 (L) 12/15/2018   HCT 25.6 (L) 12/15/2018   MCV 89.5 12/15/2018   PLT 37 (L) 12/15/2018   BMET    Component Value Date/Time   NA 140 12/20/2018 2017   K 4.1 12/13/2018 2017   CL 97 (L) 12/04/2018 2017   CO2 24 12/22/2018 2017   GLUCOSE 114 (H) 12/28/2018 2017   BUN 135 (H) 12/28/2018 2017   CREATININE 10.86 (H) 12/19/2018 2017   CALCIUM 8.5 (L) 12/11/2018 2017   GFRNONAA 4 (L) 12/22/2018 2017   GFRAA 4 (L) 12/16/2018 2017   Estimated Creatinine Clearance: 10.2 mL/min (A) (by C-G formula based on SCr of 10.86 mg/dL (H)).  COAG No results found for: INR, PROTIME  Radiology No results found.  Assessment/Plan The patient is a 48 year old female with a past medical history of thyroid disease, sleep apnea, diabetes, morbid obesity, end-stage renal disease on chronic hemodialysis who presented to the [facility] with inability to dialyze due to dialysis access issues. 1. ESRD on Chronic Hemodialysis: At this time, the patient does not have an adequate dialysis access to dialyze.  Sounds like the patient has not dialyzed in approximately a week.  Patient's potassium as of yesterday was 4.2.  We will plan on a bilateral upper extremity venogram to assess the patient's anatomy in preparation for a permanent dialysis.  We will also assess the patient's PermCath today - we will plan on either exchange or placement of a new one.  Procedure, risks and benefits explained to the patient.  All questions answered.  Patient wishes to proceed.  2.  Morbid obesity: Due to the patient's body habitus and morbid obesity  will certainly make it harder for a femoral  PermCath placement. 3.  Diabetes: Appropriate medications. Encouraged good control as its slows the progression of atherosclerotic disease  Discussed with Dr. Francene Castle, PA-C  12/15/2018 10:11 AM   This note was created with Dragon medical transcription system.  Any error is purely unintentional

## 2018-12-15 NOTE — Progress Notes (Signed)
Spoke with the night shift hospitalist as the patient has trouble signing the consent for femoral catheter placement. When trying to sign her documents she would not be able to hold the pen and would only draw lines when trying to sign her document. A CT of the head was order to try to rule out any stroke symptoms. The CT scan did not find any signs of acute stroke. The patient was alert and oriented x4 after getting back from her CT scan. She continues to be soft spoke/mubles words. Night shift RN was informed about possibly trying to get a consent for procedure now that the patient is back from the CT scan.

## 2018-12-15 NOTE — Progress Notes (Signed)
Patient ID: Lindsey Russell, female   DOB: May 07, 1971, 48 y.o.   MRN: [id number]  [employer] PROGRESS NOTE  Lindsey Russell [name] DOB: [birth year] DOA: 12/03/2018 PCP: Lindsey Collie, MD  HPI/Subjective: Patient was yelling in pain when I saw her.  I was able to calm her down and speak with her a little bit.  She answers some yes or no questions.  Objective: Vitals:   12/15/18 0051 12/15/18 0736  BP: (!) 89/71 (!) 88/59  Pulse: 92 86  Resp: (!) 24 20  Temp: 98.8 F (37.1 C) 98.2 F (36.8 C)  SpO2: 98% 97%    Filed Weights   12/17/2018 1947  Weight: (!) 167.8 kg    ROS: Review of Systems  Constitutional: Negative for chills and fever.  Eyes: Negative for blurred vision.  Respiratory: Negative for cough and shortness of breath.   Cardiovascular: Negative for chest pain.  Gastrointestinal: Positive for abdominal pain. Negative for constipation, diarrhea, nausea and vomiting.  Genitourinary: Negative for dysuria.  Musculoskeletal: Positive for joint pain.  Neurological: Negative for dizziness and headaches.   Exam: Physical Exam  HENT:  Nose: No mucosal edema.  Mouth/Throat: No oropharyngeal exudate or posterior oropharyngeal edema.  Eyes: Pupils are equal, round, and reactive to light. Conjunctivae, EOM and lids are normal.  Neck: No JVD present. Carotid bruit is not present. No edema present. No thyroid mass and no thyromegaly present.  Cardiovascular: S1 normal and S2 normal. Exam reveals no gallop.  No murmur heard. Pulses:      Dorsalis pedis pulses are 2+ on the right side and 2+ on the left side.  Respiratory: No respiratory distress. She has decreased breath sounds in the right lower field and the left lower field. She has no wheezes. She has no rhonchi. She has no rales.  GI: Soft. Bowel sounds are normal. There is no abdominal tenderness.  Musculoskeletal:     Right ankle: She exhibits swelling.     Left ankle: She exhibits swelling.  Lymphadenopathy:   She has no cervical adenopathy.  Neurological: She is alert. No cranial nerve deficit.  Skin: Skin is warm. Nails show no clubbing.  Numerous skin excoriations in the groin and buttock and sacral area.  Nothing more than a stage II.  No signs of infection.  Psychiatric: She has a normal mood and affect.      Data Reviewed: Basic Metabolic Panel: Recent Labs  Lab 12/12/18 1219 12/28/2018 2017  NA 137 140  K 4.2 4.1  CL 97* 97*  CO2 23 24  GLUCOSE 105* 114*  BUN 115* 135*  CREATININE 8.81* 10.86*  CALCIUM 8.2* 8.5*   Liver Function Tests: Recent Labs  Lab 12/12/18 1219  AST 13*  ALT 7  ALKPHOS 81  BILITOT 0.8  PROT 6.3*  ALBUMIN 2.2*   Recent Labs  Lab 12/12/18 1219  LIPASE 21   CBC: Recent Labs  Lab 12/12/18 1219 12/13/2018 2017 12/15/18 0923  WBC 5.5 9.7 5.8  NEUTROABS  --  8.0* 4.3  HGB 8.8* 8.5* 7.8*  HCT 29.0* 28.3* 25.6*  MCV 90.1 89.3 89.5  PLT 66* 44* 37*    Scheduled Meds: . buPROPion  300 mg Oral Daily  . cinacalcet  30 mg Oral Q breakfast  . docusate sodium  100 mg Oral Daily  . ferric citrate  210 mg Oral TID  . levothyroxine  75 mcg Oral QAC breakfast  . midodrine  5 mg Oral TID WC  . pantoprazole  40 mg  Oral Daily  . pregabalin  100 mg Oral BID  . sevelamer carbonate  1,600 mg Oral TID WC   Continuous Infusions: .  ceFAZolin (ANCEF) IV      Assessment/Plan:  1. Toxic granulation and left shift seen on smear.  Get blood cultures x2.  Start vancomycin and Rocephin for likely infection.  Could be from nonworking catheter. 2. Hypotension.  Patient takes midodrine at night I will increase it to 3 times a day. 3. Dialysis access issue.  Vascular surgery to do venogram and switch out access 4. Thrombocytopenia with drop in platelets.  Pathologist did not see schistocytes on smear.  Could be secondary to infection. 5. End-stage renal disease on hemodialysis.  Will need dialysis once new access placed. 6. Morbid obesity with a BMI of 59.72.   Weight loss needed 7. Hypothyroidism unspecified on levothyroxine  8. Type 2 diabetes mellitus on sliding scale 9. Stage II decubiti in groin and buttock.  Present on admission.  Barrier cream recommended by home health nurse.  Code Status:     Code Status Orders  (From admission, onward)         Start     Ordered   12/15/18 0047  Full code  Continuous     12/15/18 0046        Code Status History    This patient has a current code status but no historical code status.     Disposition Plan: To be determined  Consultants:  Vascular surgery  Nephrology  Antibiotics:  Start vancomycin  Start Rocephin  Time spent: 28 minutes  [place]

## 2018-12-16 ENCOUNTER — Inpatient Hospital Stay
Admit: 2018-12-16 | Discharge: 2018-12-16 | Disposition: A | Discharge status: Home / Self Care | Payer: Medicare Other | Attending: Infectious Diseases | Admitting: Infectious Diseases

## 2018-12-16 ENCOUNTER — Encounter: Admission: EM | Disposition: E | Payer: Self-pay | Source: Skilled Nursing Facility | Attending: Internal Medicine

## 2018-12-16 DIAGNOSIS — F329 Major depressive disorder, single episode, unspecified: Secondary | ICD-10-CM

## 2018-12-16 DIAGNOSIS — M25551 Pain in right hip: Secondary | ICD-10-CM

## 2018-12-16 DIAGNOSIS — E1122 Type 2 diabetes mellitus with diabetic chronic kidney disease: Secondary | ICD-10-CM

## 2018-12-16 DIAGNOSIS — Z885 Allergy status to narcotic agent status: Secondary | ICD-10-CM

## 2018-12-16 DIAGNOSIS — Z992 Dependence on renal dialysis: Secondary | ICD-10-CM

## 2018-12-16 DIAGNOSIS — B952 Enterococcus as the cause of diseases classified elsewhere: Secondary | ICD-10-CM

## 2018-12-16 DIAGNOSIS — R7881 Bacteremia: Secondary | ICD-10-CM

## 2018-12-16 DIAGNOSIS — I120 Hypertensive chronic kidney disease with stage 5 chronic kidney disease or end stage renal disease: Secondary | ICD-10-CM

## 2018-12-16 DIAGNOSIS — T827XXA Infection and inflammatory reaction due to other cardiac and vascular devices, implants and grafts, initial encounter: Secondary | ICD-10-CM

## 2018-12-16 DIAGNOSIS — Z6841 Body Mass Index (BMI) 40.0 and over, adult: Secondary | ICD-10-CM

## 2018-12-16 DIAGNOSIS — N186 End stage renal disease: Secondary | ICD-10-CM

## 2018-12-16 DIAGNOSIS — E039 Hypothyroidism, unspecified: Secondary | ICD-10-CM

## 2018-12-16 DIAGNOSIS — T82898A Other specified complication of vascular prosthetic devices, implants and grafts, initial encounter: Secondary | ICD-10-CM

## 2018-12-16 DIAGNOSIS — Z886 Allergy status to analgesic agent status: Secondary | ICD-10-CM

## 2018-12-16 HISTORY — PX: TEMPORARY DIALYSIS CATHETER: CATH118312

## 2018-12-16 LAB — CBC
HCT: 26.9 % — ABNORMAL LOW (ref 36.0–46.0)
Hemoglobin: 8 g/dL — ABNORMAL LOW (ref 12.0–15.0)
MCH: 26.8 pg (ref 26.0–34.0)
MCHC: 29.7 g/dL — ABNORMAL LOW (ref 30.0–36.0)
MCV: 90 fL (ref 80.0–100.0)
Platelets: 43 10*3/uL — ABNORMAL LOW (ref 150–400)
RBC: 2.99 MIL/uL — ABNORMAL LOW (ref 3.87–5.11)
RDW: 16.7 % — AB (ref 11.5–15.5)
WBC: 5.5 10*3/uL (ref 4.0–10.5)
nRBC: 0 % (ref 0.0–0.2)

## 2018-12-16 LAB — BLOOD CULTURE ID PANEL (REFLEXED)
Acinetobacter baumannii: NOT DETECTED
Candida albicans: NOT DETECTED
Candida glabrata: NOT DETECTED
Candida krusei: NOT DETECTED
Candida parapsilosis: NOT DETECTED
Candida tropicalis: NOT DETECTED
Enterobacter cloacae complex: NOT DETECTED
Enterobacteriaceae species: NOT DETECTED
Enterococcus species: DETECTED — AB
Escherichia coli: NOT DETECTED
Haemophilus influenzae: NOT DETECTED
Klebsiella oxytoca: NOT DETECTED
Klebsiella pneumoniae: NOT DETECTED
LISTERIA MONOCYTOGENES: NOT DETECTED
NEISSERIA MENINGITIDIS: NOT DETECTED
Proteus species: NOT DETECTED
Pseudomonas aeruginosa: NOT DETECTED
STAPHYLOCOCCUS SPECIES: NOT DETECTED
Serratia marcescens: NOT DETECTED
Staphylococcus aureus (BCID): NOT DETECTED
Streptococcus agalactiae: NOT DETECTED
Streptococcus pneumoniae: NOT DETECTED
Streptococcus pyogenes: NOT DETECTED
Streptococcus species: NOT DETECTED
Vancomycin resistance: NOT DETECTED

## 2018-12-16 LAB — COMPREHENSIVE METABOLIC PANEL
ALT: 10 U/L (ref 0–44)
AST: 15 U/L (ref 15–41)
Albumin: 2.1 g/dL — ABNORMAL LOW (ref 3.5–5.0)
Alkaline Phosphatase: 77 U/L (ref 38–126)
Anion gap: 20 — ABNORMAL HIGH (ref 5–15)
BUN: 154 mg/dL — ABNORMAL HIGH (ref 6–20)
CO2: 22 mmol/L (ref 22–32)
Calcium: 8.6 mg/dL — ABNORMAL LOW (ref 8.9–10.3)
Chloride: 98 mmol/L (ref 98–111)
Creatinine, Ser: 11.04 mg/dL — ABNORMAL HIGH (ref 0.44–1.00)
GFR calc Af Amer: 4 mL/min — ABNORMAL LOW (ref >60)
GFR calc non Af Amer: 4 mL/min — ABNORMAL LOW (ref >60)
Glucose, Bld: 59 mg/dL — ABNORMAL LOW (ref 70–99)
Potassium: 4.7 mmol/L (ref 3.5–5.1)
Sodium: 140 mmol/L (ref 135–145)
Total Bilirubin: 0.9 mg/dL (ref 0.3–1.2)
Total Protein: 6.2 g/dL — ABNORMAL LOW (ref 6.5–8.1)

## 2018-12-16 LAB — SURGICAL PCR SCREEN
MRSA, PCR: NEGATIVE
Staphylococcus aureus: NEGATIVE

## 2018-12-16 LAB — GLUCOSE, CAPILLARY
Glucose-Capillary: 148 mg/dL — ABNORMAL HIGH (ref 70–99)
Glucose-Capillary: 45 mg/dL — ABNORMAL LOW (ref 70–99)

## 2018-12-16 SURGERY — TEMPORARY DIALYSIS CATHETER
Anesthesia: Moderate Sedation

## 2018-12-16 SURGERY — UPPER EXTREMITY VENOGRAPHY
Anesthesia: Choice | Laterality: Bilateral

## 2018-12-16 MED ORDER — SODIUM CHLORIDE 0.9 % IV SOLN: 100.0000 mL | INTRAVENOUS | Status: DC | PRN

## 2018-12-16 MED ORDER — HYDROMORPHONE HCL 1 MG/ML IJ SOLN: 1.0000 mg | Freq: Once | INTRAMUSCULAR | Status: DC | PRN

## 2018-12-16 MED ORDER — HEPARIN SODIUM (PORCINE) 1000 UNIT/ML DIALYSIS
1000.0000 [IU] | INTRAMUSCULAR | Status: DC | PRN
  Administered 2018-12-17: 1000 [IU] via INTRAVENOUS_CENTRAL
  Filled 2018-12-16 (×2): qty 1

## 2018-12-16 MED ORDER — ONDANSETRON HCL 4 MG/2ML IJ SOLN: 4.0000 mg | Freq: Four times a day (QID) | INTRAMUSCULAR | Status: DC | PRN

## 2018-12-16 MED ORDER — FENTANYL CITRATE (PF) 100 MCG/2ML IJ SOLN
INTRAMUSCULAR | Status: AC
  Filled 2018-12-16: qty 2

## 2018-12-16 MED ORDER — DEXTROSE 50 % IV SOLN: 25.0000 mL | Freq: Once | INTRAVENOUS | Status: DC

## 2018-12-16 MED ORDER — MIDAZOLAM HCL 2 MG/ML PO SYRP
8.0000 mg | ORAL_SOLUTION | Freq: Once | ORAL | Status: DC | PRN
  Filled 2018-12-16: qty 4

## 2018-12-16 MED ORDER — EPOETIN ALFA 10000 UNIT/ML IJ SOLN
10000.0000 [IU] | INTRAMUSCULAR | Status: DC
  Administered 2018-12-16 – 2018-12-18 (×3): 10000 [IU] via INTRAVENOUS
  Filled 2018-12-16: qty 1

## 2018-12-16 MED ORDER — PENTAFLUOROPROP-TETRAFLUOROETH EX AERO
1.0000 "application " | INHALATION_SPRAY | CUTANEOUS | Status: DC | PRN
  Filled 2018-12-16: qty 30

## 2018-12-16 MED ORDER — MIDAZOLAM HCL 5 MG/5ML IJ SOLN
INTRAMUSCULAR | Status: AC
  Filled 2018-12-16: qty 5

## 2018-12-16 MED ORDER — DIPHENHYDRAMINE HCL 50 MG/ML IJ SOLN: 50.0000 mg | Freq: Once | INTRAMUSCULAR | Status: DC | PRN

## 2018-12-16 MED ORDER — METHYLPREDNISOLONE SODIUM SUCC 125 MG IJ SOLR: 125.0000 mg | Freq: Once | INTRAMUSCULAR | Status: DC | PRN

## 2018-12-16 MED ORDER — DEXTROSE 50 % IV SOLN
50.0000 mL | Freq: Once | INTRAVENOUS | Status: AC
  Administered 2018-12-16: 50 mL via INTRAVENOUS

## 2018-12-16 MED ORDER — DEXAMETHASONE SODIUM PHOSPHATE 10 MG/ML IJ SOLN
10.0000 mg | Freq: Once | INTRAMUSCULAR | Status: DC
  Filled 2018-12-16: qty 1

## 2018-12-16 MED ORDER — FAMOTIDINE 20 MG PO TABS: 40.0000 mg | ORAL_TABLET | Freq: Once | ORAL | Status: DC | PRN

## 2018-12-16 MED ORDER — FERRIC CITRATE 1 GM 210 MG(FE) PO TABS
420.0000 mg | ORAL_TABLET | Freq: Three times a day (TID) | ORAL | Status: DC
  Filled 2018-12-16 (×20): qty 2

## 2018-12-16 MED ORDER — SODIUM CHLORIDE 0.9 % IV SOLN: INTRAVENOUS | Status: DC

## 2018-12-16 MED ORDER — MIDAZOLAM HCL 2 MG/2ML IJ SOLN
INTRAMUSCULAR | Status: DC | PRN
  Administered 2018-12-16: 1 mg via INTRAVENOUS

## 2018-12-16 MED ORDER — ALTEPLASE 2 MG IJ SOLR: 2.0000 mg | Freq: Once | INTRAMUSCULAR | Status: DC | PRN

## 2018-12-16 MED ORDER — PERFLUTREN LIPID MICROSPHERE
1.0000 mL | INTRAVENOUS | Status: AC | PRN
  Administered 2018-12-16: 2 mL via INTRAVENOUS
  Filled 2018-12-16: qty 10

## 2018-12-16 MED ORDER — FLORANEX PO PACK
1.0000 g | PACK | Freq: Three times a day (TID) | ORAL | Status: DC
  Filled 2018-12-16 (×22): qty 1

## 2018-12-16 MED ORDER — CHLORHEXIDINE GLUCONATE CLOTH 2 % EX PADS
6.0000 | MEDICATED_PAD | Freq: Every day | CUTANEOUS | Status: DC
  Administered 2018-12-17 – 2018-12-23 (×6): 6 via TOPICAL

## 2018-12-16 MED ORDER — FENTANYL CITRATE (PF) 100 MCG/2ML IJ SOLN
INTRAMUSCULAR | Status: DC | PRN
  Administered 2018-12-16: 25 ug via INTRAVENOUS

## 2018-12-16 MED ORDER — DEXTROSE 50 % IV SOLN
INTRAVENOUS | Status: AC
  Administered 2018-12-16: 50 mL via INTRAVENOUS
  Filled 2018-12-16: qty 50

## 2018-12-16 MED ORDER — LIDOCAINE HCL (PF) 1 % IJ SOLN
5.0000 mL | INTRAMUSCULAR | Status: DC | PRN
  Filled 2018-12-16: qty 5

## 2018-12-16 MED ORDER — SODIUM CHLORIDE 0.9 % IV SOLN
2.0000 g | Freq: Two times a day (BID) | INTRAVENOUS | Status: DC
  Administered 2018-12-16 – 2018-12-22 (×12): 2 g via INTRAVENOUS
  Filled 2018-12-16: qty 2000
  Filled 2018-12-16 (×6): qty 2
  Filled 2018-12-16: qty 2000
  Filled 2018-12-16: qty 2
  Filled 2018-12-16: qty 2000
  Filled 2018-12-16 (×4): qty 2
  Filled 2018-12-16: qty 2000
  Filled 2018-12-16: qty 2

## 2018-12-16 MED ORDER — CEFAZOLIN SODIUM-DEXTROSE 2-4 GM/100ML-% IV SOLN
2.0000 g | Freq: Once | INTRAVENOUS | Status: DC
  Filled 2018-12-16: qty 100

## 2018-12-16 MED ORDER — LIDOCAINE-PRILOCAINE 2.5-2.5 % EX CREA
1.0000 "application " | TOPICAL_CREAM | CUTANEOUS | Status: DC | PRN
  Filled 2018-12-16: qty 5

## 2018-12-16 SURGICAL SUPPLY — 2 items
KIT DIALYSIS CATH TRI 30X13 (CATHETERS) ×3 IMPLANT
TRAY LACERAT/PLASTIC (MISCELLANEOUS) ×3 IMPLANT

## 2018-12-16 NOTE — Progress Notes (Signed)
[facility]  ROUNDING NOTE   Subjective:  Patient well known to [place] as outpt.  Presents now with nonfunctional HD Permcath. Also appears to be septic as enterococcus growing in the blood. Vascular surgery consulted.    Objective:  Vital signs in last 24 hours:  Temp:  [97.5 F (36.4 C)-99.3 F (37.4 C)] 99.3 F (37.4 C) (03/18 0521) Pulse Rate:  [80-95] 95 (03/18 0521) Resp:  [17-21] 21 (03/18 0521) BP: (97-114)/(52-62) 98/52 (03/18 0521) SpO2:  [94 %-100 %] 100 % (03/18 0521)  Weight change:  Filed Weights   11/29/2018 1947  Weight: (!) 167.8 kg    Intake/Output: I/O last 3 completed shifts: In: 100 [P.O.:100] Out: -    Intake/Output this shift:  No intake/output data recorded.  Physical Exam: General: No acute distress  Head: Normocephalic, atraumatic. Moist oral mucosal membranes  Eyes: Anicteric  Neck: Supple, trachea midline  Lungs:  Clear to auscultation, normal effort  Heart: S1S2 no rubs  Abdomen:  Soft, nontender, bowel sounds present  Extremities: 1+ peripheral edema.  Neurologic: Awake, alert, following commands  Skin: No lesions  Access: R IJ PC    Basic Metabolic Panel: Recent Labs  Lab 12/12/18 1219 12/18/2018 2017 12/03/2018 0439  NA 137 140 140  K 4.2 4.1 4.7  CL 97* 97* 98  CO2 23 24 22   GLUCOSE 105* 114* 59*  BUN 115* 135* 154*  CREATININE 8.81* 10.86* 11.04*  CALCIUM 8.2* 8.5* 8.6*    Liver Function Tests: Recent Labs  Lab 12/12/18 1219 11/29/2018 0439  AST 13* 15  ALT 7 10  ALKPHOS 81 77  BILITOT 0.8 0.9  PROT 6.3* 6.2*  ALBUMIN 2.2* 2.1*   Recent Labs  Lab 12/12/18 1219  LIPASE 21   No results for input(s): AMMONIA in the last 168 hours.  CBC: Recent Labs  Lab 12/12/18 1219 12/29/2018 2017 12/15/18 0923 12/29/2018 0439  WBC 5.5 9.7 5.8 5.5  NEUTROABS  --  8.0* 4.3  --   HGB 8.8* 8.5* 7.8* 8.0*  HCT 29.0* 28.3* 25.6* 26.9*  MCV 90.1 89.3 89.5 90.0  PLT 66* 44* 37* 43*    Cardiac Enzymes: No  results for input(s): CKTOTAL, CKMB, CKMBINDEX, TROPONINI in the last 168 hours.  BNP: Invalid input(s): POCBNP  CBG: No results for input(s): GLUCAP in the last 168 hours.  Microbiology: Results for orders placed or performed during the hospital encounter of 12/09/2018  CULTURE, BLOOD (ROUTINE X 2) w Reflex to ID Panel     Status: None (Preliminary result)   Collection Time: 12/15/18  3:09 PM  Result Value Ref Range Status   Specimen Description BLOOD LEFT HAND  Final   Special Requests   Final    AEROBIC BOTTLE ONLY Blood Culture results may not be optimal due to an inadequate volume of blood received in culture bottles   Culture  Setup Time   Final    GRAM POSITIVE COCCI AEROBIC BOTTLE ONLY CRITICAL VALUE NOTED.  VALUE IS CONSISTENT WITH PREVIOUSLY REPORTED AND CALLED VALUE. Performed at [facility], [address] [place], [place] [address]    Culture [facility] POSITIVE COCCI  Final   Report Status PENDING  Incomplete  CULTURE, BLOOD (ROUTINE X 2) w Reflex to ID Panel     Status: None (Preliminary result)   Collection Time: 12/15/18  3:16 PM  Result Value Ref Range Status   Specimen Description   Final    BLOOD RIGHT HAND Performed at [facility],  [place], [place] [address]    Special Requests   Final    BOTTLES DRAWN AEROBIC AND ANAEROBIC Blood Culture results may not be optimal due to an inadequate volume of blood received in culture bottles Performed at [facility], [address], [place], [place] [address]    Culture  Setup Time   Final    GRAM POSITIVE COCCI IN BOTH AEROBIC AND ANAEROBIC BOTTLES CRITICAL RESULT CALLED TO, READ BACK BY AND VERIFIED WITH: Vita Barley HALLHAI 12/24/2018 0448 REC Performed at [facility], [place] [address] [place], [place] [address]    Culture GRAM POSITIVE COCCI  Final   Report Status PENDING  Incomplete  Blood Culture ID Panel (Reflexed)     Status: Abnormal   Collection Time: 12/15/18   3:16 PM  Result Value Ref Range Status   Enterococcus species DETECTED (A) NOT DETECTED Final    Comment: CRITICAL RESULT CALLED TO, READ BACK BY AND VERIFIED WITH: SHEENA HALLHAI 12/06/2018 0448 REC    Vancomycin resistance NOT DETECTED NOT DETECTED Final   Listeria monocytogenes NOT DETECTED NOT DETECTED Final   Staphylococcus species NOT DETECTED NOT DETECTED Final   Staphylococcus aureus (BCID) NOT DETECTED NOT DETECTED Final   Streptococcus species NOT DETECTED NOT DETECTED Final   Streptococcus agalactiae NOT DETECTED NOT DETECTED Final   Streptococcus pneumoniae NOT DETECTED NOT DETECTED Final   Streptococcus pyogenes NOT DETECTED NOT DETECTED Final   Acinetobacter baumannii NOT DETECTED NOT DETECTED Final   Enterobacteriaceae species NOT DETECTED NOT DETECTED Final   Enterobacter cloacae complex NOT DETECTED NOT DETECTED Final   Escherichia coli NOT DETECTED NOT DETECTED Final   Klebsiella oxytoca NOT DETECTED NOT DETECTED Final   Klebsiella pneumoniae NOT DETECTED NOT DETECTED Final   Proteus species NOT DETECTED NOT DETECTED Final   Serratia marcescens NOT DETECTED NOT DETECTED Final   Haemophilus influenzae NOT DETECTED NOT DETECTED Final   Neisseria meningitidis NOT DETECTED NOT DETECTED Final   Pseudomonas aeruginosa NOT DETECTED NOT DETECTED Final   Candida albicans NOT DETECTED NOT DETECTED Final   Candida glabrata NOT DETECTED NOT DETECTED Final   Candida krusei NOT DETECTED NOT DETECTED Final   Candida parapsilosis NOT DETECTED NOT DETECTED Final   Candida tropicalis NOT DETECTED NOT DETECTED Final    Comment: Performed at [facility], [place], [address] [address]  Surgical PCR screen     Status: None   Collection Time: 12/25/2018  4:49 AM  Result Value Ref Range Status   MRSA, PCR NEGATIVE NEGATIVE Final   Staphylococcus aureus NEGATIVE NEGATIVE Final    Comment: (NOTE) The Xpert SA Assay (FDA approved for NASAL specimens in patients  30 years of age and older), is one component of a comprehensive surveillance program. It is not intended to diagnose infection nor to guide or monitor treatment. Performed at [facility], [address] [place], [place] [address]     Coagulation Studies: No results for input(s): LABPROT, INR in the last 72 hours.  Urinalysis: No results for input(s): COLORURINE, LABSPEC, PHURINE, GLUCOSEU, HGBUR, BILIRUBINUR, KETONESUR, PROTEINUR, UROBILINOGEN, NITRITE, LEUKOCYTESUR in the last 72 hours.  Invalid input(s): APPERANCEUR    Imaging: Ct Head Wo Contrast  Result Date: 12/15/2018 CLINICAL DATA:  Ataxia, stroke suspected EXAM: CT HEAD WITHOUT CONTRAST TECHNIQUE: Contiguous axial images were obtained from the base of the skull through the vertex without intravenous contrast. COMPARISON:  None. FINDINGS: Brain: Mild cerebral atrophy. No acute intracranial abnormality. Specifically,  no hemorrhage, hydrocephalus, mass lesion, acute infarction, or significant intracranial injury. Vascular: No hyperdense vessel or unexpected calcification. Skull: No acute calvarial abnormality. Sinuses/Orbits: Visualized paranasal sinuses and mastoids clear. Orbital soft tissues unremarkable. Other: None IMPRESSION: Mild age advanced cerebral volume loss. No acute intracranial abnormality. Electronically Signed   By: Rolm Baptise M.D.   On: 12/15/2018 19:22     Medications:   . sodium chloride 250 mL (12/02/2018 0706)  . ampicillin (OMNIPEN) IV 2 g (11/30/2018 0709)   . buPROPion  300 mg Oral Daily  . Chlorhexidine Gluconate Cloth  6 each Topical Q0600  . cinacalcet  30 mg Oral Q breakfast  . docusate sodium  100 mg Oral Daily  . ferric citrate  210 mg Oral TID  . lactobacillus  1 g Oral TID WC  . levothyroxine  75 mcg Oral QAC breakfast  . midodrine  5 mg Oral TID WC  . pantoprazole  40 mg Oral Daily  . pregabalin  100 mg Oral BID  . sevelamer carbonate  1,600 mg Oral TID WC   sodium chloride,  acetaminophen **OR** acetaminophen, HYDROcodone-acetaminophen, HYDROmorphone (DILAUDID) injection, meclizine, ondansetron **OR** ondansetron (ZOFRAN) IV, polyethylene glycol  Assessment/ Plan:  48 y.o. female with past medical history of ESRD on HD, morbid obesity, diabetes mellitus type 2, hypertension, anemia of chronic kidney disease admitted with non functional permcath and sepsis.  [facility]/MWF2/EDW 176.5  1.  ESRD on HD MWF.  Patient appears to be septic.  Her PermCath will be removed.  Temporary femoral dialysis catheter will be placed.  We will plan for hemodialysis later today.  2.  Anemia of chronic kidney disease/thrombocytopenia.  Hemoglobin currently 8.0.  Start the patient on Epogen 10,000 units IV with dialysis.  Platelets also low at 43,000 which may be secondary to sepsis.  3.  Secondary hyperparathyroidism.  Continue the patient on cinacalcet 30 mg p.o. daily.  Follow-up PTH and phosphorus today.  4.  Sepsis.  Enterococcus noted to be growing in the blood.  Antibiotic management as per hospitalist.  5.  Thanks for consultation.   LOS: 2 Ulysee Fyock 3/18/202010:31 AM

## 2018-12-16 NOTE — Progress Notes (Signed)
Patient having loose stools type 7. Per ED notes, Patient had been treated for C diff the last month and a half. Patient is incontinent and has wounds to bottocks and thighs. MD on Call paged and made aware and received orders for a GI panel, stool C diff and placement of a rectal tube to help heal wounds.  Patient now on enteric precautions.

## 2018-12-16 NOTE — Progress Notes (Signed)
PT Cancellation Note  Patient Details Name: Lindsey Russell MRN: [id number] DOB: 12/01/1970   Cancelled Treatment:    Reason Eval/Treat Not Completed: Other (comment).  PT consult received.  Chart reviewed.  Discussed pt with pt's nurse prior to entering room who reported pt had been refusing multiple things this morning.  Upon PT entering room, pt awake resting in bed.  Discussed PT consult and role of PT with pt.  Pt then stating feeling like she had knife-like pain in both LE's.  Discussed asking for pain medication (therapist offered to call nurse for pain meds) but pt did not answer therapist.  Attempted discussing PT with pt again and then pt reporting back pain; again discussed asking for pain medication but pt did not answer therapist.  Attempted to further talk with pt (asking if there was anything therapist could do to help pt) but pt raising her voice at therapist and appearing upset at therapist, so  therapist ended PT evaluation attempt.  Nurse notified of above including pt's reports of pain.  Plan for procedure later today.  Will re-attempt PT evaluation at a later date/time as able.  Leitha Bleak, PT 12/05/2018, 12:06 PM [phone]

## 2018-12-16 NOTE — Consult Note (Signed)
Pharmacy Antibiotic Note  Lindsey Russell is a 48 y.o. female admitted on 12/04/2018 with Vascular Access Problems. BCID now showing Enterococcus species. Pharmacy has been consulted for ampicillin  Dosing. Patient has PMH of ESRD on HD.   Plan: Start Ampicillin 2g IV every 12 hours   Height: 5\' 6"  (167.6 cm) Weight: (!) 370 lb (167.8 kg) IBW/kg (Calculated) : 59.3  Temp (24hrs), Avg:98.3 F (36.8 C), Min:97.5 F (36.4 C), Max:99.3 F (37.4 C)  Recent Labs  Lab 12/12/18 1219 12/05/2018 2017 12/15/18 0923  WBC 5.5 9.7 5.8  CREATININE 8.81* 10.86*  --     Estimated Creatinine Clearance: 10.2 mL/min (A) (by C-G formula based on SCr of 10.86 mg/dL (H)).    Allergies  Allergen Reactions  . Morphine And Related   . Naproxen     Antimicrobials this admission: 3/17 Vancomycin/CTX >> 3/18 3/18 Ampicillin>>  Microbiology results: 3/17 BCx: Enterococcus species  Thank you for allowing pharmacy to be a part of this patient's care.  Pernell Dupre, PharmD, BCPS Clinical Pharmacist 12/20/2018 5:31 AM

## 2018-12-16 NOTE — Progress Notes (Signed)
Post HD [facility]    12/04/2018 2215  Hand-Off documentation  Report given to (Full Name) Colvin Caroli, RN   Report received from (Full Name) Beatris Ship, RN   Vital Signs  Temp 98.1 F (36.7 C)  Temp Source Oral  Pulse Rate 96  Pulse Rate Source Dinamap  Resp 16  BP (!) 80/57  BP Location Right Arm  BP Method Automatic  Patient Position (if appropriate) Lying  Oxygen Therapy  SpO2 97 %  O2 Device Room Air  Pulse Oximetry Type Continuous  Pain Assessment  Pain Scale 0-10  Pain Score 0  Dialysis Weight  Weight (!) 180.3 kg  Type of Weight Post-Dialysis  Post-Hemodialysis Assessment  Rinseback Volume (mL) 250 mL  KECN 31.6 V  Dialyzer Clearance Lightly streaked  Duration of HD Treatment -hour(s) 2 hour(s) (venous chamber clotted, 30 min tx lost )  Hemodialysis Intake (mL) 500 mL  UF Total -Machine (mL) 1636 mL  Net UF (mL) 1136 mL  Tolerated HD Treatment Yes  Hemodialysis Catheter Right Femoral vein Triple-lumen;Temporary  Placement Date: (c) 12/28/2018   Placed prior to admission: No  Orientation: Right  Access Location: Femoral vein  Hemodialysis Catheter Type: Triple-lumen;Temporary  Site Condition No complications  Blue Lumen Status Heparin locked  Red Lumen Status Heparin locked  Purple Lumen Status Capped (Central line)  Catheter fill solution Heparin 1000 units/ml  Catheter fill volume (Arterial) 1.8 cc  Catheter fill volume (Venous) 1.8  Post treatment catheter status Capped and Clamped

## 2018-12-16 NOTE — Progress Notes (Signed)
HD Tx complete, TX stopped due to clotted system, 30 min Tx lost. MD notified. Pt is stable.    12/05/2018 2207  Vital Signs  Pulse Rate 92  Pulse Rate Source Dinamap  Resp 18  BP (!) 88/60  BP Location Right Arm  BP Method Automatic  Patient Position (if appropriate) Lying  Oxygen Therapy  SpO2 95 %  O2 Device Room Air  Pulse Oximetry Type Continuous  During Hemodialysis Assessment  Blood Flow Rate (mL/min) 250 mL/min  Arterial Pressure (mmHg) -220 mmHg  Venous Pressure (mmHg) 310 mmHg  Transmembrane Pressure (mmHg) 80 mmHg  Ultrafiltration Rate (mL/min) 650 mL/min  Dialysate Flow Rate (mL/min) 500 ml/min  Conductivity: Machine  13.8  HD Safety Checks Performed Yes  KECN 31.6 KECN  Dialysis Fluid Bolus Normal Saline  Bolus Amount (mL) 250 mL  Intra-Hemodialysis Comments Tx completed;Tolerated well (Venous chanmber clotted, 30 min Tx time lost )

## 2018-12-16 NOTE — Progress Notes (Signed)
Pre HD TX: pt is chronically hypotensive, but stable, pt is alert and responds to questions. B/P is expected to improve with [place] and UF.    12/20/2018 2010  Hand-Off documentation  Report given to (Full Name) Beatris Ship, RN   Report received from (Full Name) Colvin Caroli, RN   Vital Signs  Temp 98 F (36.7 C)  Temp Source Oral  Pulse Rate 87  Pulse Rate Source Dinamap  Resp 16  BP (!) 79/60  BP Location Right Arm  BP Method Automatic  Patient Position (if appropriate) Lying  Oxygen Therapy  SpO2 95 %  O2 Device Room Air  Pain Assessment  Pain Scale 0-10  Pain Score 0  Dialysis Weight  Weight (!) 181.2 kg  Type of Weight Pre-Dialysis  Time-Out for Hemodialysis  What Procedure? HD  Pt Identifiers(min of two) MRN/Account#;First/Last Name  Correct Site? Yes  Correct Side? Yes  Correct Procedure? Yes  Consents Verified? Yes  Rad Studies Available? N/A  Safety Precautions Reviewed? Yes  Engineer, civil (consulting) Number 3  Station Number  (Bedside Room 201)  UF/Alarm Test Passed  Conductivity: Meter 13.8  Conductivity: Machine  13.9  pH 7.4  Reverse Osmosis Portable 3  Normal Saline Lot Number [id number]  Dialyzer Lot Number [id number]  Disposable Set Lot Number [id number]  Machine Temperature 98.6 F (37 C)  Blood Lines Intact and Secured Yes  Pre Treatment Patient Checks  Vascular access used during treatment Catheter  Hepatitis B Surface Antigen Results Negative  Hemodialysis Consent Verified Yes  Hemodialysis Standing Orders Initiated Yes  ECG (Telemetry) Monitor On Yes  Prime Ordered Normal Saline  Length of  DialysisTreatment -hour(s) 2.5 Hour(s)  Dialysis Treatment Comments Na 140  Dialyzer Elisio 17H NR  Dialysate 2K, 2.5 Ca  Dialysis Anticoagulant None  Dialysate Flow Ordered 500  Blood Flow Rate Ordered 250 mL/min  Ultrafiltration Goal 1 Liters  Dialysis Blood Pressure Support Ordered Normal Saline  Hemodialysis Catheter Right Femoral vein  Triple-lumen;Temporary  Placement Date: (c) 12/21/2018   Placed prior to admission: No  Orientation: Right  Access Location: Femoral vein  Hemodialysis Catheter Type: Triple-lumen;Temporary  Site Condition No complications  Blue Lumen Status Blood return noted  Red Lumen Status Blood return noted  Purple Lumen Status Capped (Central line)  Dressing Type Occlusive;Gauze/Drain sponge  Dressing Change Due 12/17/18

## 2018-12-16 NOTE — Progress Notes (Signed)
Patient is A&O, answering all questions appropriately. Refusing blood cultures, meds and to sign vascular procedure consent. Notified Attending and Vascular surgeon.

## 2018-12-16 NOTE — Progress Notes (Signed)
Patient continues to refuse medications. Tried to explain reasons for giving meds. Very suspicious and thinks that we are giving her the wrong medications. She allowed the tech to get vitals but refused noon medications. Told us she was in 9/10 pain, but also refused pain medication.

## 2018-12-16 NOTE — Progress Notes (Signed)
[facility] [place] NAME: Lindsey Russell    MR#:  [id number]  DATE OF BIRTH:  Dec 09, 1970  SUBJECTIVE:  CHIEF COMPLAINT:   Chief Complaint  Patient presents with  . Vascular Access Problem  Case discussed with nephrology, case management/social work, Engineer, civil (consulting), the patient's husband, patient is encephalopathic due to acute uremia, husband would like to have the necessary procedures done to ensure dialysis is continued, earlier today patient had been refusing blood cultures as well as hemodialysis catheter placement, vascular surgery planning to remove infected hemodialysis catheter and plan on placing temporary hemodialysis catheter later today  REVIEW OF SYSTEMS:  CONSTITUTIONAL: No fever, fatigue or weakness.  EYES: No blurred or double vision.  EARS, NOSE, AND THROAT: No tinnitus or ear pain.  RESPIRATORY: No cough, shortness of breath, wheezing or hemoptysis.  CARDIOVASCULAR: No chest pain, orthopnea, edema.  GASTROINTESTINAL: No nausea, vomiting, diarrhea or abdominal pain.  GENITOURINARY: No dysuria, hematuria.  ENDOCRINE: No polyuria, nocturia,  HEMATOLOGY: No anemia, easy bruising or bleeding SKIN: No rash or lesion. MUSCULOSKELETAL: No joint pain or arthritis.   NEUROLOGIC: No tingling, numbness, weakness.  PSYCHIATRY: No anxiety or depression.   ROS  DRUG ALLERGIES:   Allergies  Allergen Reactions  . Morphine And Related   . Naproxen     VITALS:  Blood pressure (!) 98/52, pulse 95, temperature 99.3 F (37.4 C), temperature source Oral, resp. rate (!) 21, height 5\' 6"  (1.676 m), weight (!) 167.8 kg, SpO2 100 %.  PHYSICAL EXAMINATION:  GENERAL:  48 y.o.-year-old patient lying in the bed with no acute distress.  EYES: Pupils equal, round, reactive to light and accommodation. No scleral icterus. Extraocular muscles intact.  HEENT: Head atraumatic, normocephalic. Oropharynx and nasopharynx clear.  NECK:  Supple, no jugular  venous distention. No thyroid enlargement, no tenderness.  LUNGS: Normal breath sounds bilaterally, no wheezing, rales,rhonchi or crepitation. No use of accessory muscles of respiration.  CARDIOVASCULAR: S1, S2 normal. No murmurs, rubs, or gallops.  ABDOMEN: Soft, nontender, nondistended. Bowel sounds present. No organomegaly or mass.  EXTREMITIES: No pedal edema, cyanosis, or clubbing.  NEUROLOGIC: Cranial nerves II through XII are intact. Muscle strength 5/5 in all extremities. Sensation intact. Gait not checked.  PSYCHIATRIC: The patient is alert and oriented x 3.  SKIN: No obvious rash, lesion, or ulcer.   Physical Exam LABORATORY PANEL:   CBC Recent Labs  Lab 12/21/2018 0439  WBC 5.5  HGB 8.0*  HCT 26.9*  PLT 43*   ------------------------------------------------------------------------------------------------------------------  Chemistries  Recent Labs  Lab 12/12/2018 0439  NA 140  K 4.7  CL 98  CO2 22  GLUCOSE 59*  BUN 154*  CREATININE 11.04*  CALCIUM 8.6*  AST 15  ALT 10  ALKPHOS 77  BILITOT 0.9   ------------------------------------------------------------------------------------------------------------------  Cardiac Enzymes No results for input(s): TROPONINI in the last 168 hours. ------------------------------------------------------------------------------------------------------------------  RADIOLOGY:  Ct Head Wo Contrast  Result Date: 12/15/2018 CLINICAL DATA:  Ataxia, stroke suspected EXAM: CT HEAD WITHOUT CONTRAST TECHNIQUE: Contiguous axial images were obtained from the base of the skull through the vertex without intravenous contrast. COMPARISON:  None. FINDINGS: Brain: Mild cerebral atrophy. No acute intracranial abnormality. Specifically, no hemorrhage, hydrocephalus, mass lesion, acute infarction, or significant intracranial injury. Vascular: No hyperdense vessel or unexpected calcification. Skull: No acute calvarial abnormality. Sinuses/Orbits:  Visualized paranasal sinuses and mastoids clear. Orbital soft tissues unremarkable. Other: None IMPRESSION: Mild age advanced cerebral volume loss. No acute intracranial abnormality. Electronically Signed  By: Rolm Baptise M.D.   On: 12/15/2018 19:22    ASSESSMENT AND PLAN:  *Acute uremia  Secondary to worsening end-stage renal disease  Discussed with the patient's husband-he would like for everything to be done to ensure dialysis is continued and underlying infection is cured  For removal of probable infected hemodialysis catheter later today, placement of groin temporary hemodialysis catheter later today by vascular surgery   *Chronic  ESRD Currently without functional dialysis access Nephrology following for hemodialysis needs  Plan of care as stated above   *Acute enterococcal bacteremia  Probable infected hemodialysis catheter  For removal of hemodialysis catheter later today, repeat blood cultures, infectious disease consulted for expert opinion, continue ampicillin, follow-up on outstanding cultures   *Acute diarrhea  Follow-up on GI panel, C. 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 Management plans discussed with the patient, family and they are in agreement.  CODE STATUS: full  TOTAL TIME TAKING CARE OF THIS PATIENT: 40 minutes.     POSSIBLE D/C IN 2-5 DAYS, DEPENDING ON CLINICAL CONDITION.   Avel Peace Salary M.D on 12/13/2018   Between 7am to 6pm - Pager - [phone]  After 6pm go to [email or web address] - password [email or web address] [facility]  Office  [phone]  CC: Primary care physician; Marco Collie, MD  Note: This dictation was prepared with Dragon dictation along with smaller phrase technology. Any transcriptional errors that result from this process are unintentional.

## 2018-12-16 NOTE — Consult Note (Signed)
NAME: Lindsey Russell  DOB: 08-20-1971  MRN: [id number]  Date/Time: 12/02/2018 9:08 AM  REQUESTING PROVIDER: Dr Jerelyn Charles Subjective:  REASON FOR CONSULT: enterococcus bacteremia ?unable to get history from patient as lethargic  Lindsey Russell is a 48 y.o. with a history of ESRD, DM, HTN, hypothyroidism,morbid obesity depression presented to the ED with clotted fistula as the dilaysis center was not able to access her fistula Vitals in the ED were 97/62, HR 88, 97.8, wt 399 IBs She was seen by vascular  I am seeing her enterococcus bacteremia Pt says her rt hip is hurting  Pt was in [place] [facility] 2/1-2/22  For rt hip pain and missing dialysis and before that was in [facility]. She was then sent to [facility] rehab She had Cdiff at the rehab facility and was on treatment She has SVC syndrome and on 3/4 the following procedure was done by Dr.Dew 1. Ultrasound guidance for vascular access to the left internal jugular vein 2. Catheter placement into the SVC and right atrium from left jugular approach 3. Left jugular venogram and superior venacavogram 4. Percutaneous transluminal angioplasty of the left jugular and innominate veins with 8 mm diameter angioplasty balloon 5. Percutaneous transluminal angioplasty of the superior vena cava with 8 and 12 mm diameter angioplasty balloons 6. Fluoroscopic guidance for placement of catheter 7. Placement of a33cm tip to cuff tunneled hemodialysis catheter via the left internal jugular vein  She has Patent left internal jugular veinin the neck with occlusion at the clavicle. There was reconstitution of the superior vena cava just above the right atrium but multiple collaterals were present.   Medical history End-stage renal disease on hemodialysis Anemia  Arthritis Back pain CHF Depression Diabetes mellitus  GERD  Dyslipidemia Hypertension Hypothyroidism Sleep apnea Morbid obesity Sciatica Hyperparathyroidism   Past  Surgical History:  Procedure Laterality Date  . CESAREAN SECTION     x 2  . DIALYSIS/PERMA CATHETER INSERTION N/A 12/02/2018   Procedure: DIALYSIS/PERMA CATHETER INSERTION;  Surgeon: Algernon Huxley, MD;  Location: [facility];  Service: Cardiovascular;  Laterality: N/A;    Social History   Socioeconomic History  . Marital status: Married    Spouse name: Not on file  . Number of children: Not on file  . Years of education: Not on file  . Highest education level: Not on file  Occupational History  . Not on file  Social Needs  . Financial resource strain: Not on file  . Food insecurity:    Worry: Not on file    Inability: Not on file  . Transportation needs:    Medical: Not on file    Non-medical: Not on file  Tobacco Use  . Smoking status: Never Smoker  . Smokeless tobacco: Never Used  Substance and Sexual Activity  . Alcohol use: Never    Frequency: Never  . Drug use: Never  . Sexual activity: Not on file  Lifestyle  . Physical activity:    Days per week: Not on file    Minutes per session: Not on file  . Stress: Not on file  Relationships  . Social connections:    Talks on phone: Not on file    Gets together: Not on file    Attends religious service: Not on file    Active member of club or organization: Not on file    Attends meetings of clubs or organizations: Not on file    Relationship status: Not on file  .  Intimate partner violence:    Fear of current or ex partner: Not on file    Emotionally abused: Not on file    Physically abused: Not on file    Forced sexual activity: Not on file  Other Topics Concern  . Not on file  Social History Narrative  . Not on file    FH-not available as patient is lethargic Allergies  Allergen Reactions  . Morphine And Related   . Naproxen     ? Current Facility-Administered Medications  Medication Dose Route Frequency Provider Last Rate Last Dose  . 0.9 %  sodium chloride infusion   Intravenous PRN Loletha Grayer, MD 5 mL/hr at 12/07/2018 0706 250 mL at 12/20/2018 0706  . acetaminophen (TYLENOL) tablet 650 mg  650 mg Oral Q6H PRN Harrie Foreman, MD   650 mg at 12/15/18 1244   Or  . acetaminophen (TYLENOL) suppository 650 mg  650 mg Rectal Q6H PRN Harrie Foreman, MD      . ampicillin (OMNIPEN) 2 g in sodium chloride 0.9 % 100 mL IVPB  2 g Intravenous Q12H Hallaji, Sheema M, RPH 300 mL/hr at 12/12/2018 0709 2 g at 12/01/2018 0709  . buPROPion (WELLBUTRIN XL) 24 hr tablet 300 mg  300 mg Oral Daily Harrie Foreman, MD   300 mg at 12/15/18 0100  . Chlorhexidine Gluconate Cloth 2 % PADS 6 each  6 each Topical Q0600 Lateef, Munsoor, MD      . cinacalcet (SENSIPAR) tablet 30 mg  30 mg Oral Q breakfast Harrie Foreman, MD      . docusate sodium (COLACE) capsule 100 mg  100 mg Oral Daily Harrie Foreman, MD      . ferric citrate (AURYXIA) tablet 210 mg  210 mg Oral TID Harrie Foreman, MD   210 mg at 12/15/18 2115  . HYDROcodone-acetaminophen (NORCO/VICODIN) 5-325 MG per tablet 1 tablet  1 tablet Oral Q6H PRN Harrie Foreman, MD   1 tablet at 12/12/2018 0236  . HYDROmorphone (DILAUDID) injection 0.5 mg  0.5 mg Intravenous Q8H PRN Loletha Grayer, MD   0.5 mg at 12/15/18 1423  . lactobacillus (FLORANEX/LACTINEX) granules 1 g  1 g Oral TID WC Salary, Montell D, MD      . levothyroxine (SYNTHROID, LEVOTHROID) tablet 75 mcg  75 mcg Oral QAC breakfast Harrie Foreman, MD   75 mcg at 12/15/18 1135  . meclizine (ANTIVERT) tablet 25 mg  25 mg Oral BID PRN Harrie Foreman, MD      . midodrine (PROAMATINE) tablet 5 mg  5 mg Oral TID WC Loletha Grayer, MD   5 mg at 12/15/18 1624  . ondansetron (ZOFRAN) tablet 4 mg  4 mg Oral Q6H PRN Harrie Foreman, MD       Or  . ondansetron [facility]) injection 4 mg  4 mg Intravenous Q6H PRN Harrie Foreman, MD   4 mg at 12/15/18 1256  . pantoprazole (PROTONIX) EC tablet 40 mg  40 mg Oral Daily Harrie Foreman, MD   40 mg at 12/15/18 1136  . polyethylene  glycol (MIRALAX / GLYCOLAX) packet 17 g  17 g Oral Daily PRN Harrie Foreman, MD      . pregabalin (LYRICA) capsule 100 mg  100 mg Oral BID Harrie Foreman, MD   100 mg at 12/15/18 2115  . sevelamer carbonate (RENVELA) tablet 1,600 mg  1,600 mg Oral TID WC Harrie Foreman, MD   1,600  mg at 12/15/18 1624     Abtx:  Anti-infectives (From admission, onward)   Start     Dose/Rate Route Frequency Ordered Stop   11/30/2018 0600  ampicillin (OMNIPEN) 2 g in sodium chloride 0.9 % 100 mL IVPB     2 g 300 mL/hr over 20 Minutes Intravenous Every 12 hours 12/03/2018 0511     12/15/18 1444  vancomycin variable dose per unstable renal function (pharmacist dosing)  Status:  Discontinued      Does not apply See admin instructions 12/15/18 1445 11/29/2018 0509   12/15/18 1430  vancomycin (VANCOCIN) 2,000 mg in sodium chloride 0.9 % 500 mL IVPB     2,000 mg 250 mL/hr over 120 Minutes Intravenous  Once 12/15/18 1411 12/15/18 1740   12/15/18 1400  cefTRIAXone (ROCEPHIN) 1 g in sodium chloride 0.9 % 100 mL IVPB  Status:  Discontinued     1 g 200 mL/hr over 30 Minutes Intravenous Every 24 hours 12/15/18 1306 12/13/2018 0509   12/15/18 1030  ceFAZolin (ANCEF) IVPB 1 g/50 mL premix  Status:  Discontinued    Note to Pharmacy:  Please send with patient to specials   1 g 100 mL/hr over 30 Minutes Intravenous On call 12/15/18 1003 12/15/18 1554      REVIEW OF SYSTEMS:  NA Says her rt hip hurts Objective:  VITALS:  BP (!) 98/52 (BP Location: Left Wrist)   Pulse 95   Temp 99.3 F (37.4 C) (Oral)   Resp (!) 21   Ht 5\' 6"  (1.676 m)   Wt (!) 167.8 kg   SpO2 100%   BMI 59.72 kg/m  PHYSICAL EXAM:  General: lethargic, morbidly obese, myoclonic jerks over her arms Head: Normocephalic, without obvious abnormality, atraumatic. Eyes: Conjunctivae clear, anicteric sclerae. Pupils are equal ENT not examined Neck: Supple, symmetrical, no adenopathy, thyroid: non tender no carotid bruit and no JVD. Back:cannot  be examined Lungs:b/l air entry Hearts1s2 Abdomen: Soft, Extremities: rt femoral line atraumatic, no cyanosis. No edema. No clubbing Skin: did not examine Lymph: Cervical, supraclavicular normal. Neurologic: tremors, myoclonic jerks Pertinent Labs Lab Results CBC    Component Value Date/Time   WBC 5.5 12/04/2018 0439   RBC 2.99 (L) 12/11/2018 0439   HGB 8.0 (L) 12/24/2018 0439   HCT 26.9 (L) 12/29/2018 0439   PLT 43 (L) 12/06/2018 0439   MCV 90.0 12/20/2018 0439   MCH 26.8 12/20/2018 0439   MCHC 29.7 (L) 12/15/2018 0439   RDW 16.7 (H) 12/03/2018 0439   LYMPHSABS 1.1 12/15/2018 0923   MONOABS 0.4 12/15/2018 0923   EOSABS 0.0 12/15/2018 0923   BASOSABS 0.0 12/15/2018 0923    CMP Latest Ref Rng & Units 12/27/2018 12/13/2018 12/12/2018  Glucose 70 - 99 mg/dL 59(L) 114(H) 105(H)  BUN 6 - 20 mg/dL 154(H) 135(H) 115(H)  Creatinine 0.44 - 1.00 mg/dL 11.04(H) 10.86(H) 8.81(H)  Sodium 135 - 145 mmol/L 140 140 137  Potassium 3.5 - 5.1 mmol/L 4.7 4.1 4.2  Chloride 98 - 111 mmol/L 98 97(L) 97(L)  CO2 22 - 32 mmol/L 22 24 23   Calcium 8.9 - 10.3 mg/dL 8.6(L) 8.5(L) 8.2(L)  Total Protein 6.5 - 8.1 g/dL 6.2(L) - 6.3(L)  Total Bilirubin 0.3 - 1.2 mg/dL 0.9 - 0.8  Alkaline Phos 38 - 126 U/L 77 - 81  AST 15 - 41 U/L 15 - 13(L)  ALT 0 - 44 U/L 10 - 7      Microbiology: Recent Results (from the past 240 hour(s))  CULTURE, BLOOD (  ROUTINE X 2) w Reflex to ID Panel     Status: None (Preliminary result)   Collection Time: 12/15/18  3:09 PM  Result Value Ref Range Status   Specimen Description BLOOD LEFT HAND  Final   Special Requests   Final    AEROBIC BOTTLE ONLY Blood Culture results may not be optimal due to an inadequate volume of blood received in culture bottles   Culture  Setup Time   Final    GRAM POSITIVE COCCI AEROBIC BOTTLE ONLY CRITICAL VALUE NOTED.  VALUE IS CONSISTENT WITH PREVIOUSLY REPORTED AND CALLED VALUE. Performed at [facility], [address]  [place], [place] [address]    Culture [facility] POSITIVE COCCI  Final   Report Status PENDING  Incomplete  CULTURE, BLOOD (ROUTINE X 2) w Reflex to ID Panel     Status: None (Preliminary result)   Collection Time: 12/15/18  3:16 PM  Result Value Ref Range Status   Specimen Description BLOOD RIGHT HAND  Final   Special Requests   Final    BOTTLES DRAWN AEROBIC AND ANAEROBIC Blood Culture results may not be optimal due to an inadequate volume of blood received in culture bottles   Culture  Setup Time   Final    Organism ID to follow GRAM POSITIVE COCCI IN BOTH AEROBIC AND ANAEROBIC BOTTLES CRITICAL RESULT CALLED TO, READ BACK BY AND VERIFIED WITH: [facility] HALLHAI 12/08/2018 0448 REC Performed at [facility], [address] [place], [place] [address]    Culture GRAM POSITIVE COCCI  Final   Report Status PENDING  Incomplete  Blood Culture ID Panel (Reflexed)     Status: Abnormal   Collection Time: 12/15/18  3:16 PM  Result Value Ref Range Status   Enterococcus species DETECTED (A) NOT DETECTED Final    Comment: CRITICAL RESULT CALLED TO, READ BACK BY AND VERIFIED WITH: SHEENA HALLHAI 12/19/2018 0448 REC    Vancomycin resistance NOT DETECTED NOT DETECTED Final   Listeria monocytogenes NOT DETECTED NOT DETECTED Final   Staphylococcus species NOT DETECTED NOT DETECTED Final   Staphylococcus aureus (BCID) NOT DETECTED NOT DETECTED Final   Streptococcus species NOT DETECTED NOT DETECTED Final   Streptococcus agalactiae NOT DETECTED NOT DETECTED Final   Streptococcus pneumoniae NOT DETECTED NOT DETECTED Final   Streptococcus pyogenes NOT DETECTED NOT DETECTED Final   Acinetobacter baumannii NOT DETECTED NOT DETECTED Final   Enterobacteriaceae species NOT DETECTED NOT DETECTED Final   Enterobacter cloacae complex NOT DETECTED NOT DETECTED Final   Escherichia coli NOT DETECTED NOT DETECTED Final   Klebsiella oxytoca NOT DETECTED NOT DETECTED Final   Klebsiella pneumoniae NOT DETECTED NOT  DETECTED Final   Proteus species NOT DETECTED NOT DETECTED Final   Serratia marcescens NOT DETECTED NOT DETECTED Final   Haemophilus influenzae NOT DETECTED NOT DETECTED Final   Neisseria meningitidis NOT DETECTED NOT DETECTED Final   Pseudomonas aeruginosa NOT DETECTED NOT DETECTED Final   Candida albicans NOT DETECTED NOT DETECTED Final   Candida glabrata NOT DETECTED NOT DETECTED Final   Candida krusei NOT DETECTED NOT DETECTED Final   Candida parapsilosis NOT DETECTED NOT DETECTED Final   Candida tropicalis NOT DETECTED NOT DETECTED Final    Comment: Performed at [facility], [address] [place],  [address]  Surgical PCR screen     Status: None   Collection Time: 12/12/2018  4:49 AM  Result Value Ref Range Status   MRSA, PCR NEGATIVE NEGATIVE Final   Staphylococcus aureus NEGATIVE NEGATIVE  Final    Comment: (NOTE) The Xpert SA Assay (FDA approved for NASAL specimens in patients 26 years of age and older), is one component of a comprehensive surveillance program. It is not intended to diagnose infection nor to guide or monitor treatment. Performed at [facility], [address] [place],  [place] [address]     IMAGING RESULTS: I have personally reviewed the films ? Impression/Recommendation ?Enterococcus bacteremia- will need 2 d echo/TEE to r/o endocarditis Also has been c/o rt hip pain for the past month- will need MRI of the rt hip /low back to look for signs of infection. Continue ampicillin  ESRD clotted fistula- is lethargic and has myoclonic jerks- due to uremia will need to be dilaysed ? ? ___________________________________________________ Discussed with her nurse

## 2018-12-16 NOTE — Op Note (Signed)
  OPERATIVE NOTE   PROCEDURE: 1. Ultrasound guidance for vascular access right femoral vein 2. Placement of a 30 cm triple lumen dialysis catheter right femoral vein  PRE-OPERATIVE DIAGNOSIS: 1.  ESRD with bacteremia requiring removal of her PermCath  POST-OPERATIVE DIAGNOSIS: Same  SURGEON: Leotis Pain, MD  ASSISTANT(S): None  ANESTHESIA: local  ESTIMATED BLOOD LOSS: Minimal   FINDING(S): 1. None  SPECIMEN(S): None  INDICATIONS:  Patient is a 48 y.o.female who presents with bacteremia and need to remove her PermCath with signs of sepsis.  She will need a temporary dialysis catheter to get dialysis until her infection clears.  Risks and benefits were discussed, and informed consent was obtained..  DESCRIPTION: After obtaining full informed written consent, the patient was laid flat in the bed. The right femoral region was sterilely prepped and draped in a sterile surgical field was created. The right femoral vein was visualized with ultrasound and found to be widely patent. It was then accessed under direct guidance without difficulty with a Seldinger needle and a permanent image was recorded. A J-wire was then placed. After skin nick and dilatation, a 30 cm triple-lumen dialysis catheter was placed over the wire and the wire was removed. The lumens withdrew dark red nonpulsatile blood and flushed easily with sterile saline. The catheter was secured to the skin with 3 nylon sutures. Sterile dressing was placed.  COMPLICATIONS: None  CONDITION: Stable  Leotis Pain 12/14/2018 4:21 PM  This note was created with Dragon Medical transcription system. Any errors in dictation are purely unintentional.

## 2018-12-16 NOTE — Progress Notes (Signed)
PHARMACY - PHYSICIAN COMMUNICATION CRITICAL VALUE ALERT - BLOOD CULTURE IDENTIFICATION (BCID)  Lindsey Russell is an 48 y.o. female who presented to [facility] on 12/18/2018 with a chief complaint of vascular access problems.    Assessment:  BCID showing Enterococcus species  Name of physician (or Provider) Contacted: Dr Eugenie Norrie  Current antibiotics: Vancomycin and Ceftriaxone   Changes to prescribed antibiotics recommended:  Recommendations accepted by provider  Vancomycin and Ceftriaxone discontinued Pharmacy consulted for ampicillin dosing   Results for orders placed or performed during the hospital encounter of 12/09/2018  Blood Culture ID Panel (Reflexed) (Collected: 12/15/2018  3:16 PM)  Result Value Ref Range   Enterococcus species DETECTED (A) NOT DETECTED   Vancomycin resistance NOT DETECTED NOT DETECTED   Listeria monocytogenes NOT DETECTED NOT DETECTED   Staphylococcus species NOT DETECTED NOT DETECTED   Staphylococcus aureus (BCID) NOT DETECTED NOT DETECTED   Streptococcus species NOT DETECTED NOT DETECTED   Streptococcus agalactiae NOT DETECTED NOT DETECTED   Streptococcus pneumoniae NOT DETECTED NOT DETECTED   Streptococcus pyogenes NOT DETECTED NOT DETECTED   Acinetobacter baumannii NOT DETECTED NOT DETECTED   Enterobacteriaceae species NOT DETECTED NOT DETECTED   Enterobacter cloacae complex NOT DETECTED NOT DETECTED   Escherichia coli NOT DETECTED NOT DETECTED   Klebsiella oxytoca NOT DETECTED NOT DETECTED   Klebsiella pneumoniae NOT DETECTED NOT DETECTED   Proteus species NOT DETECTED NOT DETECTED   Serratia marcescens NOT DETECTED NOT DETECTED   Haemophilus influenzae NOT DETECTED NOT DETECTED   Neisseria meningitidis NOT DETECTED NOT DETECTED   Pseudomonas aeruginosa NOT DETECTED NOT DETECTED   Candida albicans NOT DETECTED NOT DETECTED   Candida glabrata NOT DETECTED NOT DETECTED   Candida krusei NOT DETECTED NOT DETECTED   Candida parapsilosis NOT DETECTED  NOT DETECTED   Candida tropicalis NOT DETECTED NOT DETECTED    Pernell Dupre, PharmD, BCPS Clinical Pharmacist 12/15/2018 5:30 AM

## 2018-12-16 NOTE — Progress Notes (Signed)
fsbs 40 upon arrival to special procedures dr. Lucky Cowboy paged and dextrose 50% 78ml ordered and given to treat hypoglycemia

## 2018-12-16 NOTE — Progress Notes (Signed)
Post HD Assessment: Pt tolerated tx well, denies complaints, no change in B/P from baseline, pt is stable.    12/15/2018 2220  Neurological  Level of Consciousness Alert  Orientation Level Oriented to person;Oriented to place  Respiratory  Respiratory Pattern Regular  Chest Assessment Chest expansion symmetrical  Bilateral Breath Sounds Diminished  Cough None  Cardiac  Pulse Irregular  Heart Sounds S1, S2  Jugular Venous Distention (JVD) Yes  Vascular  R Radial Pulse +1  L Radial Pulse +1  Edema Generalized  Generalized Edema +3  Psychosocial  Psychosocial (WDL) X  Patient Behaviors Uncooperative;Irritable  Needs Expressed Denies  Emotional support given Given to patient

## 2018-12-16 NOTE — Progress Notes (Signed)
HD TX started w/o complication    67/01/41 2013  Vital Signs  Pulse Rate 87  Pulse Rate Source Dinamap  Resp 16  BP (!) 75/59  BP Location Right Arm  BP Method Automatic  Patient Position (if appropriate) Lying  Oxygen Therapy  SpO2 94 %  O2 Device Room Air  During Hemodialysis Assessment  Blood Flow Rate (mL/min) 250 mL/min  Arterial Pressure (mmHg) -240 mmHg  Venous Pressure (mmHg) 200 mmHg  Transmembrane Pressure (mmHg) 80 mmHg  Ultrafiltration Rate (mL/min) 650 mL/min  Dialysate Flow Rate (mL/min) 500 ml/min  Conductivity: Machine  13.8  HD Safety Checks Performed Yes  Dialysis Fluid Bolus Normal Saline  Bolus Amount (mL) 250 mL  Intra-Hemodialysis Comments Tx initiated

## 2018-12-16 NOTE — Op Note (Signed)
Operative Note     Preoperative diagnosis:   1. ESRD with bacteremia requiring removal of tunneled catheter  Postoperative diagnosis:  1.  Same as above  Procedure:  Removal of left jugular Permcath  Surgeon:  Leotis Pain, MD  Assistant: Hezzie Bump, PA-C  Anesthesia:  Local  EBL:  Minimal  Indication for the Procedure:  The patient has bacteremia and her PermCath must be removed.  A temporary dialysis catheter is also being placed to give her dialysis while her infection clears.  Risks and benefits are discussed and informed consent is obtained. An assistant was present during the procedure to help facilitate the exposure and expedite the procedure.  Description of the Procedure:  The patient's left neck, chest and existing catheter were sterilely prepped and draped. The assistant provided retraction and mobilization to help facilitate exposure and expedite the procedure throughout the entire procedure.  This included following suture, using retractors, and optimizing lighting. The area around the catheter was anesthetized copiously with 1% lidocaine. The catheter was dissected out with curved hemostats until the cuff was freed from the surrounding fibrous sheath. The fiber sheath was transected, and the catheter was then removed in its entirety using gentle traction. Pressure was held and sterile dressings were placed. The patient tolerated the procedure well and was taken to the recovery room in stable condition.     Leotis Pain  12/19/2018, 4:23 PM This note was created with Dragon Medical transcription system. Any errors in dictation are purely unintentional.

## 2018-12-16 NOTE — Progress Notes (Signed)
Pre Hd Assessment    12/07/2018 2000  Neurological  Level of Consciousness Alert  Orientation Level Oriented to person;Oriented to place  Respiratory  Respiratory Pattern Regular;Labored  Chest Assessment Chest expansion symmetrical  Bilateral Breath Sounds Diminished  Cough None  Cardiac  Pulse Irregular  Heart Sounds S1, S2  Jugular Venous Distention (JVD) Yes  Vascular  R Radial Pulse +1  L Radial Pulse +1  Edema Generalized  Generalized Edema +3  Psychosocial  Psychosocial (WDL) X  Patient Behaviors Uncooperative;Irritable  Needs Expressed Denies  Emotional support given Given to patient

## 2018-12-17 ENCOUNTER — Inpatient Hospital Stay: Payer: Medicare Other

## 2018-12-17 ENCOUNTER — Encounter: Payer: Self-pay | Admitting: Pulmonary Disease

## 2018-12-17 ENCOUNTER — Encounter: Payer: Self-pay | Admitting: Vascular Surgery

## 2018-12-17 DIAGNOSIS — Z7189 Other specified counseling: Secondary | ICD-10-CM

## 2018-12-17 DIAGNOSIS — Z515 Encounter for palliative care: Secondary | ICD-10-CM

## 2018-12-17 LAB — GLUCOSE, CAPILLARY
Glucose-Capillary: 40 mg/dL — CL (ref 70–99)
Glucose-Capillary: 92 mg/dL (ref 70–99)

## 2018-12-17 LAB — BASIC METABOLIC PANEL
Anion gap: 18 — ABNORMAL HIGH (ref 5–15)
BUN: 92 mg/dL — ABNORMAL HIGH (ref 6–20)
CHLORIDE: 97 mmol/L — AB (ref 98–111)
CO2: 24 mmol/L (ref 22–32)
Calcium: 8.5 mg/dL — ABNORMAL LOW (ref 8.9–10.3)
Creatinine, Ser: 7.59 mg/dL — ABNORMAL HIGH (ref 0.44–1.00)
GFR calc Af Amer: 7 mL/min — ABNORMAL LOW (ref >60)
GFR calc non Af Amer: 6 mL/min — ABNORMAL LOW (ref >60)
Glucose, Bld: 117 mg/dL — ABNORMAL HIGH (ref 70–99)
POTASSIUM: 4 mmol/L (ref 3.5–5.1)
Sodium: 139 mmol/L (ref 135–145)

## 2018-12-17 LAB — CBC
HCT: 29 % — ABNORMAL LOW (ref 36.0–46.0)
Hemoglobin: 8.7 g/dL — ABNORMAL LOW (ref 12.0–15.0)
MCH: 27 pg (ref 26.0–34.0)
MCHC: 30 g/dL (ref 30.0–36.0)
MCV: 90.1 fL (ref 80.0–100.0)
Platelets: 46 10*3/uL — ABNORMAL LOW (ref 150–400)
RBC: 3.22 MIL/uL — ABNORMAL LOW (ref 3.87–5.11)
RDW: 17.1 % — ABNORMAL HIGH (ref 11.5–15.5)
WBC: 10.3 10*3/uL (ref 4.0–10.5)
nRBC: 0.3 % — ABNORMAL HIGH (ref 0.0–0.2)

## 2018-12-17 LAB — FIBRINOGEN: Fibrinogen: 362 mg/dL (ref 210–475)

## 2018-12-17 LAB — BLOOD GAS, ARTERIAL
ACID-BASE EXCESS: 0.1 mmol/L (ref 0.0–2.0)
BICARBONATE: 23.8 mmol/L (ref 20.0–28.0)
FIO2: 0.21
O2 SAT: 94 %
Patient temperature: 37
pCO2 arterial: 35 mmHg (ref 32.0–48.0)
pH, Arterial: 7.44 (ref 7.350–7.450)
pO2, Arterial: 68 mmHg — ABNORMAL LOW (ref 83.0–108.0)

## 2018-12-17 LAB — ECHOCARDIOGRAM COMPLETE
Height: 66 in
Weight: 5920 oz

## 2018-12-17 LAB — C DIFFICILE QUICK SCREEN W PCR REFLEX
C DIFFICILE (CDIFF) TOXIN: NEGATIVE
C Diff antigen: NEGATIVE
C Diff interpretation: NOT DETECTED

## 2018-12-17 LAB — PHOSPHORUS
Phosphorus: 7 mg/dL — ABNORMAL HIGH (ref 2.5–4.6)
Phosphorus: 5 mg/dL — ABNORMAL HIGH (ref 2.5–4.6)

## 2018-12-17 LAB — CORTISOL-AM, BLOOD: Cortisol - AM: 27.7 ug/dL — ABNORMAL HIGH (ref 6.7–22.6)

## 2018-12-17 MED ORDER — LEVOTHYROXINE SODIUM 100 MCG/5ML IV SOLN
60.0000 ug | Freq: Once | INTRAVENOUS | Status: AC
  Administered 2018-12-17: 60 ug via INTRAVENOUS
  Filled 2018-12-17: qty 5

## 2018-12-17 MED ORDER — NOREPINEPHRINE BITARTRATE 1 MG/ML IV SOLN
2.0000 ug/min | INTRAVENOUS | Status: DC
  Administered 2018-12-17: 5 ug/min via INTRAVENOUS
  Administered 2018-12-17: 7 ug/min via INTRAVENOUS
  Administered 2018-12-19: 5 ug/min via INTRAVENOUS
  Filled 2018-12-17 (×7): qty 4

## 2018-12-17 MED ORDER — SODIUM CHLORIDE 0.9 % IV SOLN: 250.0000 mL | INTRAVENOUS | Status: DC

## 2018-12-17 MED ORDER — NEPRO/CARBSTEADY PO LIQD: 237.0000 mL | Freq: Two times a day (BID) | ORAL | Status: DC

## 2018-12-17 MED ORDER — SODIUM CHLORIDE 0.9 % IV SOLN
2.0000 g | Freq: Two times a day (BID) | INTRAVENOUS | Status: DC
  Administered 2018-12-17 – 2018-12-22 (×11): 2 g via INTRAVENOUS
  Filled 2018-12-17 (×7): qty 2
  Filled 2018-12-17: qty 20
  Filled 2018-12-17: qty 2
  Filled 2018-12-17: qty 20
  Filled 2018-12-17 (×3): qty 2

## 2018-12-17 MED ORDER — SODIUM CHLORIDE 0.9 % IV BOLUS: 1000.0000 mL | Freq: Once | INTRAVENOUS | Status: DC

## 2018-12-17 MED ORDER — ALBUMIN HUMAN 25 % IV SOLN
12.5000 g | Freq: Once | INTRAVENOUS | Status: AC
  Administered 2018-12-17: 12.5 g via INTRAVENOUS
  Filled 2018-12-17: qty 50

## 2018-12-17 MED ORDER — SODIUM CHLORIDE 0.9 % IV SOLN: INTRAVENOUS | Status: DC

## 2018-12-17 MED ORDER — RENA-VITE PO TABS: 1.0000 | ORAL_TABLET | Freq: Every day | ORAL | Status: DC

## 2018-12-17 MED ORDER — SODIUM CHLORIDE 0.9 % IV BOLUS
1000.0000 mL | Freq: Once | INTRAVENOUS | Status: AC
  Administered 2018-12-17: 1000 mL via INTRAVENOUS

## 2018-12-17 MED ORDER — IOHEXOL 300 MG/ML  SOLN: 125.0000 mL | Freq: Once | INTRAMUSCULAR | Status: DC | PRN

## 2018-12-17 NOTE — Progress Notes (Signed)
Patient refusing to eat or drink anything. Complains of constant pain. RN will not be giving stronger meds due to blood pressure being low and drowsiness of patient.

## 2018-12-17 NOTE — Progress Notes (Signed)
Patient confused and oriented to self only. Blood pressure 76/52. MD notified. No new orders given.

## 2018-12-17 NOTE — Progress Notes (Signed)
[facility]  ROUNDING NOTE   Subjective:  Appreciate vascular surgery assistance. PermCath was removed yesterday. Patient still confused. Due for another dialysis treatment today.    Objective:  Vital signs in last 24 hours:  Temp:  [97.8 F (36.6 C)-98.8 F (37.1 C)] 97.8 F (36.6 C) (03/19 1100) Pulse Rate:  [71-108] 100 (03/19 1300) Resp:  [13-24] 18 (03/19 1300) BP: (55-117)/(38-70) 117/58 (03/19 1245) SpO2:  [94 %-100 %] 100 % (03/19 0403) Weight:  [180.3 kg-181.2 kg] 180.3 kg (03/19 1100)  Weight change:  Filed Weights   12/03/2018 2010 12/06/2018 2215 12/17/18 1100  Weight: (!) 181.2 kg (!) 180.3 kg (!) 180.3 kg    Intake/Output: I/O last 3 completed shifts: In: 100 [P.O.:100] Out: 1136 [Other:1136]   Intake/Output this shift:  No intake/output data recorded.  Physical Exam: General: No acute distress  Head: Normocephalic, atraumatic. Moist oral mucosal membranes  Eyes: Anicteric  Neck: Supple, trachea midline  Lungs:  Clear to auscultation, normal effort  Heart: S1S2 no rubs  Abdomen:  Soft, nontender, bowel sounds present  Extremities: 1+ peripheral edema.  Neurologic: Lethargic but arousable  Skin: No lesions  Access: L IJ PC removed, R femoral temporary dialysis catheter in place    Basic Metabolic Panel: Recent Labs  Lab 12/12/18 1219 12/02/2018 2017 12/09/2018 0439  NA 137 140 140  K 4.2 4.1 4.7  CL 97* 97* 98  CO2 23 24 22   GLUCOSE 105* 114* 59*  BUN 115* 135* 154*  CREATININE 8.81* 10.86* 11.04*  CALCIUM 8.2* 8.5* 8.6*    Liver Function Tests: Recent Labs  Lab 12/12/18 1219 12/28/2018 0439  AST 13* 15  ALT 7 10  ALKPHOS 81 77  BILITOT 0.8 0.9  PROT 6.3* 6.2*  ALBUMIN 2.2* 2.1*   Recent Labs  Lab 12/12/18 1219  LIPASE 21   No results for input(s): AMMONIA in the last 168 hours.  CBC: Recent Labs  Lab 12/12/18 1219 12/04/2018 2017 12/15/18 0923 12/21/2018 0439  WBC 5.5 9.7 5.8 5.5  NEUTROABS  --  8.0* 4.3  --    HGB 8.8* 8.5* 7.8* 8.0*  HCT 29.0* 28.3* 25.6* 26.9*  MCV 90.1 89.3 89.5 90.0  PLT 66* 44* 37* 43*    Cardiac Enzymes: No results for input(s): CKTOTAL, CKMB, CKMBINDEX, TROPONINI in the last 168 hours.  BNP: Invalid input(s): POCBNP  CBG: Recent Labs  Lab 12/01/2018 1536 12/21/2018 1539 12/28/2018 1558  GLUCAP 40* 74* 148*    Microbiology: Results for orders placed or performed during the hospital encounter of 12/27/2018  CULTURE, BLOOD (ROUTINE X 2) w Reflex to ID Panel     Status: Abnormal (Preliminary result)   Collection Time: 12/15/18  3:09 PM  Result Value Ref Range Status   Specimen Description   Final    BLOOD LEFT HAND Performed at [facility], [address] [place], [place] [address]    Special Requests   Final    AEROBIC BOTTLE ONLY Blood Culture results may not be optimal due to an inadequate volume of blood received in culture bottles Performed at [facility], [address] [place], [place] [address]    Culture  Setup Time   Final    [place].  VALUE IS CONSISTENT WITH PREVIOUSLY REPORTED AND CALLED VALUE. Performed at [facility], [address], [place], [place] [address]    Culture (A)  Final    ENTEROCOCCUS FAECALIS SUSCEPTIBILITIES TO FOLLOW  Performed at [facility], [place] [address] [place], [place] [address]    Report Status PENDING  Incomplete  CULTURE, BLOOD (ROUTINE X 2) w Reflex to ID Panel     Status: Abnormal (Preliminary result)   Collection Time: 12/15/18  3:16 PM  Result Value Ref Range Status   Specimen Description   Final    BLOOD RIGHT HAND Performed at [facility], [address] [place], [place] [address]    Special Requests   Final    BOTTLES DRAWN AEROBIC AND ANAEROBIC Blood Culture results may not be optimal due to an inadequate volume of blood received in culture bottles Performed at [facility], [address] [place], [place] [address]    Culture  Setup Time   Final    GRAM POSITIVE COCCI IN BOTH AEROBIC AND ANAEROBIC BOTTLES CRITICAL RESULT CALLED TO, READ BACK BY AND VERIFIED WITH: SHEENA HALLHAI 12/03/2018 0448 REC    Culture (A)  Final    ENTEROCOCCUS SPECIES SUSCEPTIBILITIES TO FOLLOW Performed at [facility], [place] [address], [place], [place] [address]    Report Status PENDING  Incomplete  Blood Culture ID Panel (Reflexed)     Status: Abnormal   Collection Time: 12/15/18  3:16 PM  Result Value Ref Range Status   Enterococcus species DETECTED (A) NOT DETECTED Final    Comment: CRITICAL RESULT CALLED TO, READ BACK BY AND VERIFIED WITH: SHEENA HALLHAI 12/04/2018 0448 REC    Vancomycin resistance NOT DETECTED NOT DETECTED Final   Listeria monocytogenes NOT DETECTED NOT DETECTED Final   Staphylococcus species NOT DETECTED NOT DETECTED Final   Staphylococcus aureus (BCID) NOT DETECTED NOT DETECTED Final   Streptococcus species NOT DETECTED NOT DETECTED Final   Streptococcus agalactiae NOT DETECTED NOT DETECTED Final   Streptococcus pneumoniae NOT DETECTED NOT DETECTED Final   Streptococcus pyogenes NOT DETECTED NOT DETECTED Final   Acinetobacter baumannii NOT DETECTED NOT DETECTED Final   Enterobacteriaceae species NOT DETECTED NOT DETECTED Final   Enterobacter cloacae complex NOT DETECTED NOT DETECTED Final   Escherichia coli NOT DETECTED NOT DETECTED Final   Klebsiella oxytoca NOT DETECTED NOT DETECTED Final   Klebsiella pneumoniae NOT DETECTED NOT DETECTED Final   Proteus species NOT DETECTED NOT DETECTED Final   Serratia marcescens NOT DETECTED NOT DETECTED Final   Haemophilus influenzae NOT DETECTED NOT DETECTED Final   Neisseria meningitidis NOT DETECTED NOT DETECTED Final   Pseudomonas aeruginosa NOT DETECTED NOT DETECTED Final   Candida albicans NOT DETECTED NOT DETECTED Final   Candida glabrata NOT DETECTED NOT DETECTED Final   Candida krusei NOT DETECTED NOT DETECTED  Final   Candida parapsilosis NOT DETECTED NOT DETECTED Final   Candida tropicalis NOT DETECTED NOT DETECTED Final    Comment: Performed at [facility], [place], [place] [address]  Surgical PCR screen     Status: None   Collection Time: 12/19/2018  4:49 AM  Result Value Ref Range Status   MRSA, PCR NEGATIVE NEGATIVE Final   Staphylococcus aureus NEGATIVE NEGATIVE Final    Comment: (NOTE) The Xpert SA Assay (FDA approved for NASAL specimens in patients 18 years of age and older), is one component of a comprehensive surveillance program. It is not intended to diagnose infection nor to guide or monitor treatment. Performed at [facility], [address] [place], [place] [address]   C difficile quick scan w PCR reflex     Status: None   Collection  Time: 12/17/18  6:25 AM  Result Value Ref Range Status   C Diff antigen NEGATIVE NEGATIVE Final   C Diff toxin NEGATIVE NEGATIVE Final   C Diff interpretation No C. difficile detected.  Final    Comment: Performed at [facility], [address] [place], [place] [address]    Coagulation Studies: No results for input(s): LABPROT, INR in the last 72 hours.  Urinalysis: No results for input(s): COLORURINE, LABSPEC, PHURINE, GLUCOSEU, HGBUR, BILIRUBINUR, KETONESUR, PROTEINUR, UROBILINOGEN, NITRITE, LEUKOCYTESUR in the last 72 hours.  Invalid input(s): APPERANCEUR    Imaging: Ct Head Wo Contrast  Result Date: 12/15/2018 CLINICAL DATA:  Ataxia, stroke suspected EXAM: CT HEAD WITHOUT CONTRAST TECHNIQUE: Contiguous axial images were obtained from the base of the skull through the vertex without intravenous contrast. COMPARISON:  None. FINDINGS: Brain: Mild cerebral atrophy. No acute intracranial abnormality. Specifically, no hemorrhage, hydrocephalus, mass lesion, acute infarction, or significant intracranial injury. Vascular: No hyperdense vessel or unexpected calcification. Skull: No acute calvarial  abnormality. Sinuses/Orbits: Visualized paranasal sinuses and mastoids clear. Orbital soft tissues unremarkable. Other: None IMPRESSION: Mild age advanced cerebral volume loss. No acute intracranial abnormality. Electronically Signed   By: Rolm Baptise M.D.   On: 12/15/2018 19:22     Medications:   . sodium chloride 250 mL (12/07/2018 0706)  . sodium chloride    . sodium chloride    . sodium chloride    . albumin human    . ampicillin (OMNIPEN) IV 2 g (12/17/18 9826)  . norepinephrine (LEVOPHED) Adult infusion    . sodium chloride     . buPROPion  300 mg Oral Daily  . Chlorhexidine Gluconate Cloth  6 each Topical Q0600  . cinacalcet  30 mg Oral Q breakfast  . docusate sodium  100 mg Oral Daily  . epoetin (EPOGEN/PROCRIT) injection  10,000 Units Intravenous Q M,W,F-HD  . feeding supplement (NEPRO CARB STEADY)  237 mL Oral BID BM  . ferric citrate  420 mg Oral TID  . lactobacillus  1 g Oral TID WC  . levothyroxine  75 mcg Oral QAC breakfast  . midodrine  5 mg Oral TID WC  . multivitamin  1 tablet Oral QHS  . pantoprazole  40 mg Oral Daily  . pregabalin  100 mg Oral BID  . sevelamer carbonate  1,600 mg Oral TID WC   sodium chloride, sodium chloride, sodium chloride, acetaminophen **OR** acetaminophen, alteplase, heparin, HYDROcodone-acetaminophen, HYDROmorphone (DILAUDID) injection, HYDROmorphone (DILAUDID) injection, HYDROmorphone (DILAUDID) injection, lidocaine (PF), lidocaine-prilocaine, meclizine, ondansetron **OR** ondansetron (ZOFRAN) IV, ondansetron (ZOFRAN) IV, ondansetron (ZOFRAN) IV, pentafluoroprop-tetrafluoroeth, polyethylene glycol  Assessment/ Plan:  48 y.o. female with past medical history of ESRD on HD, morbid obesity, diabetes mellitus type 2, hypertension, anemia of chronic kidney disease admitted with non functional permcath and sepsis.  [facility]/MWF2/EDW 176.5  1.  ESRD on HD MWF.  Patient had hemodialysis performed yesterday.  Since the patient has missed a  number of dialysis sessions we plan for another dialysis session today as well.  We will plan for dialysis tomorrow as well.  2.  Anemia of chronic kidney disease/thrombocytopenia.  Hemoglobin 8.0.  Maintain the patient on Epogen.  3.  Secondary hyperparathyroidism.  Check serum phosphorus tomorrow.  4.  Sepsis.  Enterococcus noted to be growing in the blood.  Antibiotic management as per hospitalist.   LOS: 3 Bruk [place] 3/19/20201:29 PM

## 2018-12-17 NOTE — Progress Notes (Addendum)
Patient continues to have fluctuating mental status, noted worsening blood pressure, concern for swallowing-patient refusing Midodrine, case discussed with intensivist, will transfer to stepdown unit for further stabilization, give another liter normal saline solution, start Levophed drip, husband was notified with all questions answered

## 2018-12-17 NOTE — Progress Notes (Signed)
Pre HD Tx Assessment   12/17/18 1100  Neurological  Level of Consciousness Responds to Voice  Orientation Level Oriented to person;Disoriented to situation;Disoriented to time;Disoriented to place  Respiratory  Respiratory Pattern Labored  Bilateral Breath Sounds Diminished  Cardiac  Pulse Irregular  Heart Sounds S1, S2  Jugular Venous Distention (JVD) No  ECG Monitor Yes  Vascular  R Radial Pulse +1  L Radial Pulse +1  R Dorsalis Pedis Pulse +1  L Dorsalis Pedis Pulse +1  Edema Generalized  RUE Edema +2  LUE Edema +2  RLE Edema +2  LLE Edema +2  Integumentary  Integumentary (WDL) X  Skin Color Appropriate for ethnicity  Skin Condition Dry;Flaky  Skin Integrity MASD;Other (Comment) (sacral wound, wounds on bilat thighs)  Musculoskeletal  Musculoskeletal (WDL) X  Generalized Weakness Yes  Gastrointestinal  Bowel Sounds Assessment Active  GU Assessment  Genitourinary (WDL) X  Genitourinary Symptoms Anuria  Psychosocial  Psychosocial (WDL) X  Patient Behaviors Uncooperative;Irritable  Needs Expressed Denies  Emotional support given Given to patient

## 2018-12-17 NOTE — Progress Notes (Signed)
Post HD Tx    12/17/18 1339  Vital Signs  Pulse Rate 90  Resp 18  BP (!) 103/41  Post-Hemodialysis Assessment  Rinseback Volume (mL) 1000 mL  KECN 21.2 V  Dialyzer Clearance Heavily streaked  Duration of HD Treatment -hour(s) 2 hour(s)  Hemodialysis Intake (mL) 1250 mL  UF Total -Machine (mL) 860 mL  Net UF (mL) -390 mL  Tolerated HD Treatment No (Comment) (Ended early per MD Salary d/t low BP/tranfer ICU)  Post-Hemodialysis Comments Pt to be transf.to ICU. Art blood gas drawn by Resp

## 2018-12-17 NOTE — Progress Notes (Signed)
HD Tx Start   BP remains decreased, pt refusing midodrine. Will monitor BP and provide ongoing bedside assessment throughout tx in order to detect changes in pt status.     12/17/18 1110  Vital Signs  Pulse Rate 98  Resp 16  BP (!) 80/47  During Hemodialysis Assessment  Blood Flow Rate (mL/min) 250 mL/min  Arterial Pressure (mmHg) -180 mmHg  Venous Pressure (mmHg) 200 mmHg  Transmembrane Pressure (mmHg) 80 mmHg  Ultrafiltration Rate (mL/min) 800 mL/min (800 mL per HOUR removal)  Dialysate Flow Rate (mL/min) 500 ml/min  Conductivity: Machine  14  HD Safety Checks Performed Yes  Dialysis Fluid Bolus Normal Saline  Bolus Amount (mL) 250 mL  Intra-Hemodialysis Comments Tx initiated

## 2018-12-17 NOTE — Progress Notes (Signed)
HD Tx End   12/17/18 1315  Hand-Off documentation  Report given to (Full Name) Lewie Chamber RN  Report received from (Full Name) Trellis Paganini RN   Vital Signs  Pulse Rate 100  Pulse Rate Source Monitor  Resp 18  BP (!) 110/33  BP Location Right Arm  BP Method Automatic  Patient Position (if appropriate) Lying  Pain Assessment  Pain Scale PAINAD  PAINAD (Pain Assessment in Advanced Dementia)  Breathing 2  Negative Vocalization 1  Facial Expression 1  Body Language 1  Consolability 2  PAINAD Score 7  Dialysis Weight  Weight (!) 180.3 kg  Type of Weight Post-Dialysis  During Hemodialysis Assessment  Blood Flow Rate (mL/min) 250 mL/min  Arterial Pressure (mmHg) -280 mmHg  Venous Pressure (mmHg) 190 mmHg  Transmembrane Pressure (mmHg) 40 mmHg  Ultrafiltration Rate (mL/min) 0 mL/min  Dialysate Flow Rate (mL/min) 500 ml/min  Conductivity: Machine  14  HD Safety Checks Performed Yes  Dialysis Fluid Bolus Normal Saline  Bolus Amount (mL) 1000 mL (per MD (Salary) order at bedside)  Intra-Hemodialysis Comments Tx completed (Tx ends early per MD.Transfer to ICU.Albumin given. )  Hemodialysis Catheter Right Femoral vein Triple-lumen;Temporary  Placement Date: (c) 12/06/2018   Placed prior to admission: No  Orientation: Right  Access Location: Femoral vein  Hemodialysis Catheter Type: Triple-lumen;Temporary  Site Condition No complications  Blue Lumen Status Flushed;Saline locked;Capped (Central line);Heparin locked  Red Lumen Status Flushed;Saline locked;Capped (Central line);Heparin locked  Catheter fill solution Heparin 1000 units/ml  Catheter fill volume (Arterial) 1.8 cc  Catheter fill volume (Venous) 1.8  Dressing Type Occlusive;Gauze/Drain sponge  Dressing Status Dressing reinforced;Clean;Dry;Intact  Post treatment catheter status Capped and Clamped

## 2018-12-17 NOTE — Progress Notes (Addendum)
[facility] [place] NAME: Lindsey Russell    MR#:  [id number]  DATE OF BIRTH:  09/15/1971  SUBJECTIVE:  CHIEF COMPLAINT:   Chief Complaint  Patient presents with  . Vascular Access Problem  Case discussed with nephrology, case management/social work, nursing staff, the patient continues to refuse p.o. medications, resistant to physical examination-the patient does not wish to be touched, remains lethargic intermittently noted hypotension into the 70s overnight-we will treat with fluids gently, for CT of the abdomen/pelvis to evaluate for possible hip/spinal cause of underlying bacteremia.  Status post operative procedure last night with new right femoral hemodialysis catheter placement, removal of left IJ hemodialysis catheter   REVIEW OF SYSTEMS:  CONSTITUTIONAL: No fever, fatigue or weakness.  EYES: No blurred or double vision.  EARS, NOSE, AND THROAT: No tinnitus or ear pain.  RESPIRATORY: No cough, shortness of breath, wheezing or hemoptysis.  CARDIOVASCULAR: No chest pain, orthopnea, edema.  GASTROINTESTINAL: No nausea, vomiting, diarrhea or abdominal pain.  GENITOURINARY: No dysuria, hematuria.  ENDOCRINE: No polyuria, nocturia,  HEMATOLOGY: No anemia, easy bruising or bleeding SKIN: No rash or lesion. MUSCULOSKELETAL: No joint pain or arthritis.   NEUROLOGIC: No tingling, numbness, weakness.  PSYCHIATRY: No anxiety or depression.   ROS  DRUG ALLERGIES:   Allergies  Allergen Reactions  . Morphine And Related   . Naproxen     VITALS:  Blood pressure (!) 80/47, pulse 98, temperature 97.8 F (36.6 C), temperature source Oral, resp. rate 16, height 5\' 6"  (1.676 m), weight (!) 180.3 kg, SpO2 100 %.  PHYSICAL EXAMINATION:  GENERAL:  48 y.o.-year-old patient lying in the bed with no acute distress.  EYES: Pupils equal, round, reactive to light and accommodation. No scleral icterus. Extraocular muscles intact.  HEENT: Head atraumatic,  normocephalic. Oropharynx and nasopharynx clear.  NECK:  Supple, no jugular venous distention. No thyroid enlargement, no tenderness.  LUNGS: Normal breath sounds bilaterally, no wheezing, rales,rhonchi or crepitation. No use of accessory muscles of respiration.  CARDIOVASCULAR: S1, S2 normal. No murmurs, rubs, or gallops.  ABDOMEN: Soft, nontender, nondistended. Bowel sounds present. No organomegaly or mass.  EXTREMITIES: No pedal edema, cyanosis, or clubbing.  NEUROLOGIC: Cranial nerves II through XII are intact. Muscle strength 5/5 in all extremities. Sensation intact. Gait not checked.  PSYCHIATRIC: The patient is alert and oriented x 3.  SKIN: No obvious rash, lesion, or ulcer.   Physical Exam LABORATORY PANEL:   CBC Recent Labs  Lab 12/20/2018 0439  WBC 5.5  HGB 8.0*  HCT 26.9*  PLT 43*   ------------------------------------------------------------------------------------------------------------------  Chemistries  Recent Labs  Lab 12/06/2018 0439  NA 140  K 4.7  CL 98  CO2 22  GLUCOSE 59*  BUN 154*  CREATININE 11.04*  CALCIUM 8.6*  AST 15  ALT 10  ALKPHOS 77  BILITOT 0.9   ------------------------------------------------------------------------------------------------------------------  Cardiac Enzymes No results for input(s): TROPONINI in the last 168 hours. ------------------------------------------------------------------------------------------------------------------  RADIOLOGY:  Ct Head Wo Contrast  Result Date: 12/15/2018 CLINICAL DATA:  Ataxia, stroke suspected EXAM: CT HEAD WITHOUT CONTRAST TECHNIQUE: Contiguous axial images were obtained from the base of the skull through the vertex without intravenous contrast. COMPARISON:  None. FINDINGS: Brain: Mild cerebral atrophy. No acute intracranial abnormality. Specifically, no hemorrhage, hydrocephalus, mass lesion, acute infarction, or significant intracranial injury. Vascular: No hyperdense vessel or  unexpected calcification. Skull: No acute calvarial abnormality. Sinuses/Orbits: Visualized paranasal sinuses and mastoids clear. Orbital soft tissues unremarkable. Other: None IMPRESSION: Mild age  advanced cerebral volume loss. No acute intracranial abnormality. Electronically Signed   By: Rolm Baptise M.D.   On: 12/15/2018 19:22    ASSESSMENT AND PLAN:  *Acute uremia  Stable Suspected secondary to worsening end-stage renal disease  Discussed with the patient's husband-he would like for everything to be done to ensure dialysis is continued and underlying infection is cured, status post left IJ permacath removal and right femoral hemodialysis temporary catheter placement on December 16, 2018, For removal of probable infected hemodialysis catheter later today, placement of groin temporary hemodialysis catheter later today by vascular surgery   *Acute on chronic hypotension Patient is noncompliant with medication administration, refuses to take Midodrine, give 1 L bolus normal saline, vitals per routine, and continue close medical monitoring  *Chronic  ESRD Stable Discussed with nephrology-possible repeat hemodialysis later today  *Acute enterococcal bacteremia  Probable infected hemodialysis catheter-left IJ permacatheter removed December 16, 2018 by vascular surgery Refuses to have repeat blood cultures done  Infectious disease input appreciated-for CT of the abdomen/pelvis with IV contrast to evaluate for possible infectious source-concern for chronic back/hip pain, follow-up on echocardiogram, cardiology consulted for TEE for further investigation of possible endocarditis Continue empiric ampicillin for now, follow-up on outstanding cultures/sensitivities  *Acute diarrhea  Improved C. 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 Stable Continue local wound care Barrier cream recommended by home health nurse  *Chronic noncompliance of medical management The importance of compliance was strongly encouraged to the patient Patient continues to refuse meds/treatment/being touched in any way The patient's husband has been the decision maker given her acute waxing and waning mental status  Disposition pending clinical course, case discussed with the patient's husband with all questions answered given the patient's acute uremia, palliative care consulted  All the records are reviewed and case discussed with Care Management/Social Workerr. Management plans discussed with the patient, family and they are in agreement.  CODE STATUS: full  TOTAL TIME TAKING CARE OF THIS PATIENT: 40 minutes.     POSSIBLE D/C IN 2-5 DAYS, DEPENDING ON CLINICAL CONDITION.   Avel Peace Salary M.D on 12/17/2018   Between 7am to 6pm - Pager - [phone]  After 6pm go to [email or web address] - password [email or web address] [facility]  Office  [phone]  CC: Primary care physician; Marco Collie, MD  Note: This dictation was prepared with Dragon dictation along with smaller phrase technology. Any transcriptional errors that result from this process are unintentional.

## 2018-12-17 NOTE — Progress Notes (Signed)
Pre HD Tx    12/17/18 1100  Hand-Off documentation  Report given to (Full Name) Trellis Paganini RN  Report received from (Full Name) Lewie Chamber RN  Vital Signs  Temp 97.8 F (36.6 C)  Temp Source Oral  Pulse Rate 91  Pulse Rate Source Monitor  Resp 16  BP (!) 82/52  BP Location Right Arm  BP Method Automatic  Patient Position (if appropriate) Lying  Pain Assessment  Pain Scale PAINAD  PAINAD (Pain Assessment in Advanced Dementia)  Breathing 2  Negative Vocalization 1  Facial Expression 1  Body Language 1  Consolability 2  PAINAD Score 7  Dialysis Weight  Weight (!) 180.3 kg  Type of Weight Pre-Dialysis  Time-Out for Hemodialysis  What Procedure? Hemodialysis   Pt Identifiers(min of two) First/Last Name;MRN/Account#  Correct Site? Yes  Correct Side? Yes  Correct Procedure? Yes  Consents Verified? Yes  Rad Studies Available? N/A  Safety Precautions Reviewed? Yes  Engineer, civil (consulting) Number 3  Station Number  (Room 213 bedside)  UF/Alarm Test Passed  Conductivity: Meter 14  Conductivity: Machine  14  pH 7.4  Reverse Osmosis Main  Normal Saline Lot Number [id number]  Dialyzer Lot Number [id number]  Disposable Set Lot Number [id number]  Machine Temperature 98.6 F (37 C)  Musician and Audible Yes  Blood Lines Intact and Secured Yes  Pre Treatment Patient Checks  Vascular access used during treatment Catheter  Hepatitis B Surface Antigen Results Negative  Date Hepatitis B Surface Antigen Drawn 12/08/18  Isolation Initiated Yes  Hepatitis B Surface Antibody  (<10)  Date Hepatitis B Surface Antibody Drawn 12/08/18  Hemodialysis Consent Verified Yes  Hemodialysis Standing Orders Initiated Yes  ECG (Telemetry) Monitor On Yes  Prime Ordered Normal Saline  Length of  DialysisTreatment -hour(s) 3 Hour(s)  Dialysis Treatment Comments Na 140  Dialyzer Elisio 17H NR  Dialysate 2K, 2.5 Ca  Dialysate Flow Ordered 500  Blood Flow Rate Ordered 250 mL/min   Ultrafiltration Goal 1.5 Liters  Pre Treatment Labs Phosphorus  Dialysis Blood Pressure Support Ordered Normal Saline  Education / Care Plan  Dialysis Education Provided Yes  Documented Education in Care Plan Yes  Fistula / Graft Left Forearm  Placement Date/Time: 12/13/17 0620   Placed prior to admission: Yes  Orientation: Left  Access Location: Forearm  Site Condition Other (Comment) (not currently being used, no longer functioning)  Fistula / Graft Assessment Absent   Drainage Description None  Hemodialysis Catheter Right Femoral vein Triple-lumen;Temporary  Placement Date: (c) 12/07/2018   Placed prior to admission: No  Orientation: Right  Access Location: Femoral vein  Hemodialysis Catheter Type: Triple-lumen;Temporary  Site Condition No complications  Blue Lumen Status Capped (Central line)  Dressing Type Occlusive;Gauze/Drain sponge  Dressing Status Dressing reinforced;Clean;Dry;Intact  Drainage Description None  Dressing Change Due 12/18/18

## 2018-12-17 NOTE — Progress Notes (Signed)
Patient Alert and refusing oral meds. Bolus given. Will re-check BP once complete.

## 2018-12-17 NOTE — Progress Notes (Signed)
Pt transported to CT Abd Pelvis, however pt exceeded CT scan dimension limit.  Therefore, discontinued CT Abd Pelvis order.  Marda Stalker, [place] Pager [phone] (please enter 7 digits) PCCM Consult Pager [phone] (please enter 7 digits)

## 2018-12-17 NOTE — Progress Notes (Addendum)
Pt to ICU 11 approx 1400, unable to take PO meds d/t lethargy, but eyes are open and she does respond to verbal stimulation; she remains to altered to take POs.  ABG drawn, WDL.  Levophed infusing thru nursing lumen of vas cath.  HOB is nearly flat at this time, SBP soft but MAP in mid 60s.  O2 sats 100% on room air.  BP cuff on right wrist.

## 2018-12-17 NOTE — Progress Notes (Addendum)
Pt's TSH noted >15 by writing RN, home dose of 75 mcgs synthroid may be insufficient or she may not have been taking it; she has refused meds at times per family and per floor RN, Per Dr Lanney Gins we will give 41 mcgs IV synthroid and check a serum cortisol level.

## 2018-12-17 NOTE — Progress Notes (Signed)
PT Cancellation Note  Patient Details Name: Lindsey Russell MRN: [id number] DOB: [birth year]   Cancelled Treatment:    Reason Eval/Treat Not Completed: Patient not medically ready.  Chart reviewed.  Discussed pt with pt's nurse who reports pt currently receiving dialysis in room and recommending holding PT at this time (vitals/medical concerns discussed).  Will hold PT at this time and re-attempt PT evaluation at a later date/time as medically appropriate.  Leitha Bleak, PT 12/17/18, 1:02 PM [phone]

## 2018-12-17 NOTE — Progress Notes (Signed)
PT Cancellation Note  Patient Details Name: Lindsey Russell MRN: [id number] DOB: 1971/07/25   Cancelled Treatment:    Reason Eval/Treat Not Completed: Medical issues which prohibited therapy.  Pt transferred to CCU 12/17/18 d/t medical concerns.  D/t pt transferring to higher level of care, per PT protocol require new PT consult in order to continue therapy (will discontinue current PT order d/t this).  Please re-consult PT when pt is medically appropriate to participate in PT.  Leitha Bleak, PT 12/17/18, 4:14 PM [phone]

## 2018-12-17 NOTE — Consult Note (Signed)
CRITICAL CARE NOTE      CHIEF COMPLAINT:   Altered mental status with encephalopathy.   HPI   This is a 48 year old female with a history of ESRD with multiple failed AV grafts,, currently using temporary right groin HD access.  She also has a history of super morbid obesity, severe sleep apnea, hypothyroidism, diabetes, recent history of C. difficile status post treatment however work-up of stool today show C. difficile antigen and toxin negative.  Patient initially came to the ED on 14 March with severe abdominal pain however came to the ED for clotted dialysis catheter at the left subclavian site from dialysis center, she subsequently came back into the ED to have a femoral dialysis catheter placed on 16 March she was also noted to be transiently hypotensive and was admitted at that time.  While inpatient she was empirically started on vancomycin and Rocephin due to findings of toxic granulation on peripheral blood film suggestive of possible infection.  She does also have stage II sacral decubiti as well as groin decubiti which had been managed with barrier cream by home health nurse.  She was then found to have blood cultures positive for enterococcus and was switched to ampicillin 2 g every 12.  She was seen by infectious disease service on March 18 who also had recommended a transesophageal echo to rule out endocarditis as well as imaging of the right hip and lumbar spine to rule out joint infection and spinal/paraspinal abscess.  While on the floor she was noted to again be hypotensive with worsening mental status and was subsequently upgraded to medical intensive care unit.  She has not received her transesophageal echo due to unstable clinical status.  On my evaluation patient is in no acute distress however is  encephalopathic and spontaneously opens eyes with verbalization of incomprehensible language. I have discussed case and care plan Maryelizabeth Kaufmann POA and daughter.     PAST MEDICAL HISTORY   Past Medical History:  Diagnosis Date   Diabetes mellitus without complication (HCC)    Hyperkalemia    Renal disorder    end stage renal disease   Sleep apnea    Thyroid disease      SURGICAL HISTORY   Past Surgical History:  Procedure Laterality Date   CESAREAN SECTION     x 2   DIALYSIS/PERMA CATHETER INSERTION N/A 12/02/2018   Procedure: DIALYSIS/PERMA CATHETER INSERTION;  Surgeon: Algernon Huxley, MD;  Location: [facility];  Service: Cardiovascular;  Laterality: N/A;   TEMPORARY DIALYSIS CATHETER N/A 12/13/2018   Procedure: TEMPORARY DIALYSIS CATHETER PLACEMENT WITH SEDATION;  Surgeon: Algernon Huxley, MD;  Location: [facility];  Service: Cardiovascular;  Laterality: N/A;     FAMILY HISTORY   History reviewed. No pertinent family history.   SOCIAL HISTORY   Social History   Tobacco Use   Smoking status: Never Smoker   Smokeless tobacco: Never Used  Substance Use Topics   Alcohol use: Never    Frequency: Never   Drug use: Never     MEDICATIONS   Current Medication:  Current Facility-Administered Medications:    0.9 %  sodium chloride infusion, , Intravenous, PRN, Algernon Huxley, MD, Last Rate: 5 mL/hr at 12/13/2018 0706, 250 mL at 12/01/2018 0706   0.9 %  sodium chloride infusion, 100 mL, Intravenous, PRN, Lateef, Munsoor, MD   0.9 %  sodium chloride infusion, 100 mL, Intravenous, PRN, Lateef, Munsoor, MD   0.9 %  sodium chloride  infusion, , Intravenous, Continuous, Salary, Montell D, MD   0.9 %  sodium chloride infusion, 250 mL, Intravenous, Continuous, Salary, Montell D, MD   acetaminophen (TYLENOL) tablet 650 mg, 650 mg, Oral, Q6H PRN, 650 mg at 12/17/18 0721 **OR** acetaminophen (TYLENOL) suppository 650 mg, 650 mg, Rectal, Q6H PRN, Lucky Cowboy, Erskine Squibb, MD   alteplase (CATHFLO ACTIVASE) injection 2 mg, 2 mg, Intracatheter, Once PRN, Lateef, Munsoor, MD   ampicillin (OMNIPEN) 2 g in sodium chloride 0.9 % 100 mL IVPB, 2 g, Intravenous, Q12H, Dew, Erskine Squibb, MD, Last Rate: 300 mL/hr at 12/17/18 0628, 2 g at 12/17/18 4818   buPROPion (WELLBUTRIN XL) 24 hr tablet 300 mg, 300 mg, Oral, Daily, Dew, Erskine Squibb, MD, 300 mg at 12/15/18 0100   Chlorhexidine Gluconate Cloth 2 % PADS 6 each, 6 each, Topical, Q0600, Algernon Huxley, MD, 6 each at 12/17/18 0630   cinacalcet (SENSIPAR) tablet 30 mg, 30 mg, Oral, Q breakfast, Dew, Erskine Squibb, MD   docusate sodium (COLACE) capsule 100 mg, 100 mg, Oral, Daily, Dew, Erskine Squibb, MD   epoetin alfa (EPOGEN,PROCRIT) injection 10,000 Units, 10,000 Units, Intravenous, Q M,W,F-HD, Algernon Huxley, MD, 10,000 Units at 12/17/18 1240   feeding supplement (NEPRO CARB STEADY) liquid 237 mL, 237 mL, Oral, BID BM, Salary, Montell D, MD   ferric citrate (AURYXIA) tablet 420 mg, 420 mg, Oral, TID, Dew, Erskine Squibb, MD   heparin injection 1,000 Units, 1,000 Units, Dialysis, PRN, Holley Raring, Munsoor, MD, 1,000 Units at 12/17/18 1334   HYDROcodone-acetaminophen (NORCO/VICODIN) 5-325 MG per tablet 1 tablet, 1 tablet, Oral, Q6H PRN, Algernon Huxley, MD, 1 tablet at 12/22/2018 1728   HYDROmorphone (DILAUDID) injection 0.5 mg, 0.5 mg, Intravenous, Q8H PRN, Algernon Huxley, MD, 0.5 mg at 12/15/18 1423   HYDROmorphone (DILAUDID) injection 1 mg, 1 mg, Intravenous, Once PRN, Lucky Cowboy, Erskine Squibb, MD   HYDROmorphone (DILAUDID) injection 1 mg, 1 mg, Intravenous, Once PRN, Lucky Cowboy, Erskine Squibb, MD   lactobacillus (FLORANEX/LACTINEX) granules 1 g, 1 g, Oral, TID WC, Dew, Erskine Squibb, MD   levothyroxine (SYNTHROID, LEVOTHROID) injection 60 mcg, 60 mcg, Intravenous, Once, Ottie Glazier, MD   levothyroxine (SYNTHROID, LEVOTHROID) tablet 75 mcg, 75 mcg, Oral, QAC breakfast, Dew, Erskine Squibb, MD, 75 mcg at 12/15/18 1135   lidocaine (PF) (XYLOCAINE) 1 % injection 5 mL, 5 mL,  Intradermal, PRN, Lateef, Munsoor, MD   lidocaine-prilocaine (EMLA) cream 1 application, 1 application, Topical, PRN, Lateef, Munsoor, MD   meclizine (ANTIVERT) tablet 25 mg, 25 mg, Oral, BID PRN, Lucky Cowboy, Erskine Squibb, MD   midodrine (PROAMATINE) tablet 5 mg, 5 mg, Oral, TID WC, Dew, Erskine Squibb, MD, 5 mg at 12/17/18 1240   multivitamin (RENA-VIT) tablet 1 tablet, 1 tablet, Oral, QHS, Salary, Montell D, MD   norepinephrine (LEVOPHED) 4 mg in dextrose 5 % 250 mL (0.016 mg/mL) infusion, 2-10 mcg/min, Intravenous, Titrated, Salary, Montell D, MD, Last Rate: 18.75 mL/hr at 12/17/18 1437, 5 mcg/min at 12/17/18 1437   ondansetron (ZOFRAN) tablet 4 mg, 4 mg, Oral, Q6H PRN **OR** ondansetron (ZOFRAN) injection 4 mg, 4 mg, Intravenous, Q6H PRN, Algernon Huxley, MD, 4 mg at 12/15/18 1256   ondansetron (ZOFRAN) injection 4 mg, 4 mg, Intravenous, Q6H PRN, Dew, Erskine Squibb, MD   ondansetron (ZOFRAN) injection 4 mg, 4 mg, Intravenous, Q6H PRN, Dew, Erskine Squibb, MD   pantoprazole (PROTONIX) EC tablet 40 mg, 40 mg, Oral, Daily, Dew, Erskine Squibb, MD, 40 mg at 12/15/18 1136   pentafluoroprop-tetrafluoroeth (GEBAUERS) aerosol 1  application, 1 application, Topical, PRN, Lateef, Munsoor, MD   polyethylene glycol (MIRALAX / GLYCOLAX) packet 17 g, 17 g, Oral, Daily PRN, Lucky Cowboy, Erskine Squibb, MD   pregabalin (LYRICA) capsule 100 mg, 100 mg, Oral, BID, Dew, Erskine Squibb, MD, 100 mg at 12/15/18 2115   sevelamer carbonate (RENVELA) tablet 1,600 mg, 1,600 mg, Oral, TID WC, Algernon Huxley, MD, 1,600 mg at 12/15/18 1624   sodium chloride 0.9 % bolus 1,000 mL, 1,000 mL, Intravenous, Once, Salary, Montell D, MD    ALLERGIES   Morphine and related and Naproxen    REVIEW OF SYSTEMS    Unable to obtain due to acutely ill state  PHYSICAL EXAMINATION   Vitals:   12/17/18 1420 12/17/18 1500  BP: (!) 81/66 (!) 89/55  Pulse: 99   Resp: 14 13  Temp: 37.3 C   SpO2: 100%     GENERAL: No apparent distress, encephalopathic HEAD: Normocephalic,  atraumatic.  EYES: Pupils equal, round, reactive to light.  No scleral icterus.  MOUTH: Moist mucosal membrane. NECK: Supple. No thyromegaly. No nodules. No JVD.  PULMONARY: Decreased breath sounds bilaterally CARDIOVASCULAR: S1 and S2. Regular rate and rhythm. No murmurs, rubs, or gallops.  GASTROINTESTINAL: Soft, nontender, non-distended. No masses. Positive bowel sounds. No hepatosplenomegaly.  MUSCULOSKELETAL: No swelling, clubbing, or edema.  NEUROLOGIC: Mild distress due to acute illness SKIN:intact,warm,dry   LABS AND IMAGING     -I personally reviewed most recent blood work, imaging and microbiology - significant findings today are uremia with a BUN of 154 and creatinine at 11.204, thrombocytopenia and anemia, hypothyroidism with TSH of 15.  LAB RESULTS: Recent Labs  Lab 12/12/18 1219 12/04/2018 2017 12/03/2018 0439  NA 137 140 140  K 4.2 4.1 4.7  CL 97* 97* 98  CO2 23 24 22   BUN 115* 135* 154*  CREATININE 8.81* 10.86* 11.04*  GLUCOSE 105* 114* 59*   Recent Labs  Lab 12/01/2018 2017 12/15/18 0923 12/15/2018 0439  HGB 8.5* 7.8* 8.0*  HCT 28.3* 25.6* 26.9*  WBC 9.7 5.8 5.5  PLT 44* 37* 43*     IMAGING RESULTS: No results found.    ASSESSMENT AND PLAN    -Multidisciplinary rounds held today    CARDIAC FAILURE- -hypotension- possibly due to sepsis vs hypothyroidsim - conservative fluids and levothyroxine.  -oxygen as needed -follow up cardiac enzymes as indicated ICU monitoring   Renal Failure-ESRD on HD -appreciate nephrology input -follow chem 7 -follow UO    NEUROLOGY -encephalopathy - uremia vs septic  -on HD and IV antibiotcs   Sepsis with enterococcus bacteremia    - on ampicillin   -random cortisol  - fibrinogen for poss DIC    Thrombocytopenia    - possibly due to sepsis vs HITs    -high risk for heparin antibody induced due to recurrent heparin exposure - serotonin release pending   -peripheral blood film for  chistocytes   ID -continue IV abx as prescibed -follow up cultures   GI/Nutrition GI PROPHYLAXIS as indicated DIET-->TF's as tolerated Constipation protocol as indicated  ENDO - ICU hypoglycemic\Hyperglycemia protocol -check FSBS per protocol   ELECTROLYTES -follow labs as needed -replace as needed -pharmacy consultation   DVT/GI PRX ordered -SCDs  TRANSFUSIONS AS NEEDED MONITOR FSBS ASSESS the need for LABS as needed   Critical care provider statement:    Critical care time (minutes):  105   Critical care time was exclusive of:  Separately billable procedures and treating other patients   Critical care was necessary  to treat or prevent imminent or life-threatening deterioration of the following conditions:   Encephalopathy, sepsis with bacteremia, hypothyroidism, sacral wound, super morbid obesity, diabetes mellitus, hypertension, thrombocytopenia, ESRD, anemia   Critical care was time spent personally by me on the following activities:  Development of treatment plan with patient or surrogate, discussions with consultants, evaluation of patient's response to treatment, examination of patient, obtaining history from patient or surrogate, ordering and performing treatments and interventions, ordering and review of laboratory studies and re-evaluation of patient's condition.  I assumed direction of critical care for this patient from another provider in my specialty: no    This document was prepared using Dragon voice recognition software and may include unintentional dictation errors.    Ottie Glazier, M.D.  Division of [facility]

## 2018-12-17 NOTE — Consult Note (Signed)
Consultation Note Date: 12/17/2018   Patient Name: Lindsey Russell Date  DOB: 1970/11/12  MRN: [id number]  Age / Sex: 48 y.o., female  PCP: Marco Collie, MD Referring Physician: Gorden Harms, MD  Reason for Consultation: Establishing goals of care  HPI/Patient Profile:   Clinical Assessment and Goals of Care: Lindsey Russell is resting in bed and says "hey" and falls back to sleep. No family at bedside. Receiving dialysis at this time. Attempted to reach her husband without success, and a VM was left. Spoke with her daughter. Her daughter states her mother's husband is her stepfather. She states her stepfather does not have a phone at this time, and is using a voicemail app which he can only check when he is near wi-fi.    She tells me of her mother's previous hospitalizations. She states her mother wanted her to be her HPOA, but never completed the paperwork, so her husband is her Media planner. She states prior to this hospitalization, her mother was alert, talking, and eating. She has not walked in a couple of months because she is afraid of falling.  She has been in rehab to work on this.  Lindsey Russell states her mother has told her dialysis drains her, but she has been on dialysis for years. She and her mother have never discussed GOC. She states she believes her mother would want any care possible. She states her uncle had CPR and was resusitated.    She is aware a VM was left for her stepfather.     SUMMARY OF RECOMMENDATIONS   Will continue to follow. Patient is not oriented to have a [place] conversation. Unabel to reach husband who is surrogate Media planner. Daughter states she has never had [place] conversations with her mother, but believes she would want any care possible.   Prognosis:   Poor overall  Discharge Planning: To Be Determined      Primary Diagnoses: Present on Admission: **None**   I have  reviewed the medical record, interviewed the patient and family, and examined the patient. The following aspects are pertinent.  Past Medical History:  Diagnosis Date  . Diabetes mellitus without complication ([place])   . Hyperkalemia   . Renal disorder    end stage renal disease  . Sleep apnea   . Thyroid disease    Social History   Socioeconomic History  . Marital status: Married    Spouse name: Not on file  . Number of children: Not on file  . Years of education: Not on file  . Highest education level: Not on file  Occupational History  . Not on file  Social Needs  . Financial resource strain: Not on file  . Food insecurity:    Worry: Not on file    Inability: Not on file  . Transportation needs:    Medical: Not on file    Non-medical: Not on file  Tobacco Use  . Smoking status: Never Smoker  . Smokeless tobacco: Never Used  Substance and Sexual Activity  .  Alcohol use: Never    Frequency: Never  . Drug use: Never  . Sexual activity: Not on file  Lifestyle  . Physical activity:    Days per week: Not on file    Minutes per session: Not on file  . Stress: Not on file  Relationships  . Social connections:    Talks on phone: Not on file    Gets together: Not on file    Attends religious service: Not on file    Active member of club or organization: Not on file    Attends meetings of clubs or organizations: Not on file    Relationship status: Not on file  Other Topics Concern  . Not on file  Social History Narrative  . Not on file   History reviewed. No pertinent family history. Scheduled Meds: . buPROPion  300 mg Oral Daily  . Chlorhexidine Gluconate Cloth  6 each Topical Q0600  . cinacalcet  30 mg Oral Q breakfast  . docusate sodium  100 mg Oral Daily  . epoetin (EPOGEN/PROCRIT) injection  10,000 Units Intravenous Q M,W,F-HD  . feeding supplement (NEPRO CARB STEADY)  237 mL Oral BID BM  . ferric citrate  420 mg Oral TID  . lactobacillus  1 g Oral TID WC   . levothyroxine  75 mcg Oral QAC breakfast  . midodrine  5 mg Oral TID WC  . multivitamin  1 tablet Oral QHS  . pantoprazole  40 mg Oral Daily  . pregabalin  100 mg Oral BID  . sevelamer carbonate  1,600 mg Oral TID WC   Continuous Infusions: . sodium chloride 250 mL (12/27/2018 0706)  . sodium chloride    . sodium chloride    . ampicillin (OMNIPEN) IV 2 g (12/17/18 0628)   PRN Meds:.sodium chloride, sodium chloride, sodium chloride, acetaminophen **OR** acetaminophen, alteplase, heparin, HYDROcodone-acetaminophen, HYDROmorphone (DILAUDID) injection, HYDROmorphone (DILAUDID) injection, HYDROmorphone (DILAUDID) injection, lidocaine (PF), lidocaine-prilocaine, meclizine, ondansetron **OR** ondansetron (ZOFRAN) IV, ondansetron (ZOFRAN) IV, ondansetron (ZOFRAN) IV, pentafluoroprop-tetrafluoroeth, polyethylene glycol Medications Prior to Admission:  Prior to Admission medications   Medication Sig Start Date End Date Taking? Authorizing Provider  buPROPion (WELLBUTRIN XL) 300 MG 24 hr tablet Take 300 mg by mouth daily.   Yes Care, [facility]  cinacalcet (SENSIPAR) 30 MG tablet Take 30 mg by mouth daily with breakfast.   Yes Care, [facility]  docusate sodium (COLACE) 100 MG capsule Take 100 mg by mouth daily.   Yes Care, [facility]  FERRIC CITRATE PO Take 210 mg by mouth 3 (three) times daily.   Yes Care, [facility]  HYDROcodone-acetaminophen (NORCO/VICODIN) 5-325 MG tablet Take 1 tablet by mouth every 6 (six) hours as needed for moderate pain.   Yes Care, [facility]  levothyroxine (SYNTHROID, LEVOTHROID) 75 MCG tablet Take 75 mcg by mouth daily before breakfast.   Yes Care, [facility]  meclizine (ANTIVERT) 25 MG tablet Take 25 mg by mouth 2 (two) times daily as needed for dizziness.   Yes [provider]  midodrine (PROAMATINE) 5 MG tablet Take 5 mg by mouth 3 (three) times a week. EVERY TUES, TH, SAT. ON DIALYSIS DAYS   Yes Care, [facility]  omeprazole  (PRILOSEC) 20 MG capsule Take 20 mg by mouth daily. DELAYED RELEASE   Yes Care, [facility]  polyethylene glycol (MIRALAX / GLYCOLAX) packet Take 17 g by mouth daily as needed.   Yes Care, [facility]  pregabalin (LYRICA) 100 MG capsule Take 100 mg by mouth 2 (  two) times daily.   Yes Care, [facility]  promethazine (PHENERGAN) 12.5 MG tablet Take 12.5 mg by mouth every 6 (six) hours as needed for nausea or vomiting.   Yes Care, [facility]  sevelamer carbonate (RENVELA) 800 MG tablet Take 1,600 mg by mouth 3 (three) times daily with meals. WITH MEALS    Yes Care,  Health   Allergies  Allergen Reactions  . Morphine And Related   . Naproxen    Review of Systems  Unable to perform ROS   Physical Exam Pulmonary:     Effort: Pulmonary effort is normal.  Skin:    General: Skin is warm and dry.     Vital Signs: BP (!) (P) 82/48   Pulse (P) 100   Temp 97.8 F (36.6 C) (Oral)   Resp (P) 16   Ht 5\' 6"  (1.676 m)   Wt (!) 180.3 kg   SpO2 100%   BMI 64.16 kg/m  Pain Scale: PAINAD POSS *See Group Information*: 2-Acceptable,Slightly drowsy, easily aroused Pain Score: 0-No pain   SpO2: SpO2: 100 % O2 Device:SpO2: 100 % O2 Flow Rate: .O2 Flow Rate (L/min): 2 L/min  IO: Intake/output summary:   Intake/Output Summary (Last 24 hours) at 12/17/2018 1223 Last data filed at 12/15/2018 2215 Gross per 24 hour  Intake -  Output 1136 ml  Net -1136 ml    LBM: Last BM Date: 12/29/2018 Baseline Weight: Weight: (!) 167.8 kg Most recent weight: Weight: (!) 180.3 kg     Palliative Assessment/Data:    Flowsheet Rows     Most Recent Value  Intake Tab  Referral Department  Hospitalist  Unit at Time of Referral  Med/Surg Unit  Date Notified  12/08/2018  Palliative Care Type  New Palliative care  Reason for referral  Clarify Goals of Care  Date of Admission  12/17/2018  # of days IP prior to Palliative referral  2  Clinical Assessment  Psychosocial & Spiritual  Assessment  Palliative Care Outcomes      Time In: 12:00 Time Out: 12:55 Time Total: 50 min Greater than 50%  of this time was spent counseling and coordinating care related to the above assessment and plan.  Signed by: Asencion Gowda, NP   Please contact Palliative Medicine Team phone at [phone] for questions and concerns.  For individual provider: See Shea Evans

## 2018-12-17 NOTE — Progress Notes (Addendum)
Post HD Assessment  Hemodialysis ended early per MD (Salary) d/t inability to sustain adequate blood pressure - ordered 1L fluid bolus, post tx -- BP increased to 103/41. Albumin given at tx end as well. Midodrine given during tx, however did not show sustained improvement in BP with that dose.   Right femoral temp cath was not functioning properly throughout tx after flushes, reversal of lumens and repositioning of patient. BFR had to be reduced as a result for a portion of the tx.   Pt responding to voice, yet agitated and c/o pain in the low back/buttocks. Readjusted pt for comfort with inpatient staff, however pt remains restless. ICU bed requested per MD.     12/17/18 1343  Neurological  Level of Consciousness Responds to Voice  Orientation Level Oriented to person;Disoriented to situation;Disoriented to time;Disoriented to place  Respiratory  Respiratory Pattern Labored  Bilateral Breath Sounds Diminished  Cardiac  Pulse Irregular  Heart Sounds S1, S2  Jugular Venous Distention (JVD) No  ECG Monitor Yes  Vascular  R Radial Pulse +1  L Radial Pulse +1  R Dorsalis Pedis Pulse +1  L Dorsalis Pedis Pulse +1  Edema Generalized  RUE Edema +2  LUE Edema +2  RLE Edema +2  LLE Edema +2  Integumentary  Integumentary (WDL) X  Skin Color Appropriate for ethnicity  Skin Condition Dry;Flaky  Skin Integrity MASD;Other (Comment) (sacral wound, wounds on bilat thighs)  Musculoskeletal  Musculoskeletal (WDL) X  Generalized Weakness Yes  Gastrointestinal  Bowel Sounds Assessment Active  GU Assessment  Genitourinary (WDL) X  Genitourinary Symptoms Anuria  Psychosocial  Psychosocial (WDL) X  Patient Behaviors Uncooperative;Irritable  Needs Expressed Denies  Emotional support given Given to patient

## 2018-12-17 NOTE — Progress Notes (Signed)
   [facility]iology consulted for TEE.   Due to unstable vitals with systolic pressures in the 80s, unable to give anesthesia necessary for endoscopy. Defer TEE until stable vitals. Continue to monitor vital signs until that time.   Will take off of NPO and place diet orders.  Signed, Arvil Chaco, PA-C 12/17/2018, 12:38 PM Pager [phone]

## 2018-12-17 NOTE — Progress Notes (Signed)
ID Pt in ICU but currently out of bed for CT  Enterococcus in both sets- concern for endocarditis especially being ESRD and also having issues with grafts. Also rt hip/lowback infection need to be ruled out because of pain. TEE On ampicillin will add ceftriaxone to empirically treat as endocarditis Discussed with her nurse and intensivist

## 2018-12-18 ENCOUNTER — Encounter: Payer: Self-pay | Admitting: Nurse Practitioner

## 2018-12-18 LAB — CULTURE, BLOOD (ROUTINE X 2)

## 2018-12-18 LAB — CBC
HEMATOCRIT: 27.6 % — AB (ref 36.0–46.0)
Hemoglobin: 8.2 g/dL — ABNORMAL LOW (ref 12.0–15.0)
MCH: 26.8 pg (ref 26.0–34.0)
MCHC: 29.7 g/dL — ABNORMAL LOW (ref 30.0–36.0)
MCV: 90.2 fL (ref 80.0–100.0)
Platelets: 38 10*3/uL — ABNORMAL LOW (ref 150–400)
RBC: 3.06 MIL/uL — ABNORMAL LOW (ref 3.87–5.11)
RDW: 17.3 % — ABNORMAL HIGH (ref 11.5–15.5)
WBC: 6.1 10*3/uL (ref 4.0–10.5)
nRBC: 0.8 % — ABNORMAL HIGH (ref 0.0–0.2)

## 2018-12-18 LAB — BASIC METABOLIC PANEL
ANION GAP: 16 — AB (ref 5–15)
BUN: 97 mg/dL — ABNORMAL HIGH (ref 6–20)
CO2: 25 mmol/L (ref 22–32)
Calcium: 8.9 mg/dL (ref 8.9–10.3)
Chloride: 99 mmol/L (ref 98–111)
Creatinine, Ser: 8.31 mg/dL — ABNORMAL HIGH (ref 0.44–1.00)
GFR calc Af Amer: 6 mL/min — ABNORMAL LOW (ref >60)
GFR calc non Af Amer: 5 mL/min — ABNORMAL LOW (ref >60)
Glucose, Bld: 102 mg/dL — ABNORMAL HIGH (ref 70–99)
Potassium: 4 mmol/L (ref 3.5–5.1)
SODIUM: 140 mmol/L (ref 135–145)

## 2018-12-18 LAB — COMPREHENSIVE METABOLIC PANEL
ALK PHOS: 88 U/L (ref 38–126)
ALT: 14 U/L (ref 0–44)
ANION GAP: 13 (ref 5–15)
AST: 22 U/L (ref 15–41)
Albumin: 2 g/dL — ABNORMAL LOW (ref 3.5–5.0)
BUN: 30 mg/dL — ABNORMAL HIGH (ref 6–20)
CO2: 27 mmol/L (ref 22–32)
Calcium: 8.3 mg/dL — ABNORMAL LOW (ref 8.9–10.3)
Chloride: 97 mmol/L — ABNORMAL LOW (ref 98–111)
Creatinine, Ser: 3.06 mg/dL — ABNORMAL HIGH (ref 0.44–1.00)
GFR calc Af Amer: 20 mL/min — ABNORMAL LOW (ref >60)
GFR calc non Af Amer: 17 mL/min — ABNORMAL LOW (ref >60)
Glucose, Bld: 110 mg/dL — ABNORMAL HIGH (ref 70–99)
Potassium: 2.9 mmol/L — ABNORMAL LOW (ref 3.5–5.1)
Sodium: 137 mmol/L (ref 135–145)
Total Bilirubin: 0.7 mg/dL (ref 0.3–1.2)
Total Protein: 6.2 g/dL — ABNORMAL LOW (ref 6.5–8.1)

## 2018-12-18 LAB — CBC WITH DIFFERENTIAL/PLATELET
Abs Immature Granulocytes: 0.31 10*3/uL — ABNORMAL HIGH (ref 0.00–0.07)
Basophils Absolute: 0 10*3/uL (ref 0.0–0.1)
Basophils Relative: 1 %
Eosinophils Absolute: 0 10*3/uL (ref 0.0–0.5)
Eosinophils Relative: 0 %
HCT: 27.7 % — ABNORMAL LOW (ref 36.0–46.0)
HEMOGLOBIN: 8.2 g/dL — AB (ref 12.0–15.0)
Immature Granulocytes: 5 %
LYMPHS ABS: 1.5 10*3/uL (ref 0.7–4.0)
LYMPHS PCT: 21 %
MCH: 26.7 pg (ref 26.0–34.0)
MCHC: 29.6 g/dL — ABNORMAL LOW (ref 30.0–36.0)
MCV: 90.2 fL (ref 80.0–100.0)
Monocytes Absolute: 0.4 10*3/uL (ref 0.1–1.0)
Monocytes Relative: 6 %
Neutro Abs: 4.7 10*3/uL (ref 1.7–7.7)
Neutrophils Relative %: 67 %
Platelets: 42 10*3/uL — ABNORMAL LOW (ref 150–400)
RBC: 3.07 MIL/uL — ABNORMAL LOW (ref 3.87–5.11)
RDW: 17.1 % — ABNORMAL HIGH (ref 11.5–15.5)
WBC: 6.9 10*3/uL (ref 4.0–10.5)
nRBC: 0.7 % — ABNORMAL HIGH (ref 0.0–0.2)

## 2018-12-18 LAB — PHOSPHORUS: Phosphorus: 2.1 mg/dL — ABNORMAL LOW (ref 2.5–4.6)

## 2018-12-18 LAB — APTT
aPTT: 88 seconds — ABNORMAL HIGH (ref 24–36)
aPTT: 84 seconds — ABNORMAL HIGH (ref 24–36)

## 2018-12-18 LAB — PARATHYROID HORMONE, INTACT (NO CA): PTH: 520 pg/mL — ABNORMAL HIGH (ref 15–65)

## 2018-12-18 LAB — MAGNESIUM: Magnesium: 1.8 mg/dL (ref 1.7–2.4)

## 2018-12-18 MED ORDER — PANTOPRAZOLE SODIUM 40 MG IV SOLR
40.0000 mg | Freq: Every day | INTRAVENOUS | Status: DC
  Administered 2018-12-18 – 2018-12-22 (×5): 40 mg via INTRAVENOUS
  Filled 2018-12-18 (×5): qty 40

## 2018-12-18 MED ORDER — SODIUM CHLORIDE 0.9 % IV SOLN: 250.0000 mL | INTRAVENOUS | Status: DC | PRN

## 2018-12-18 MED ORDER — ANTICOAGULANT SODIUM CITRATE 4% (200MG/5ML) IV SOLN
5.0000 mL | Freq: Two times a day (BID) | Status: DC | PRN
  Filled 2018-12-18: qty 5

## 2018-12-18 MED ORDER — SODIUM CHLORIDE 0.9% FLUSH
3.0000 mL | Freq: Two times a day (BID) | INTRAVENOUS | Status: DC
  Administered 2018-12-18 – 2018-12-19 (×2): 3 mL via INTRAVENOUS

## 2018-12-18 MED ORDER — ARGATROBAN 50 MG/50ML IV SOLN
0.2000 ug/kg/min | INTRAVENOUS | Status: DC
  Administered 2018-12-18 – 2018-12-20 (×6): 0.5 ug/kg/min via INTRAVENOUS
  Administered 2018-12-21: 0.2 ug/kg/min via INTRAVENOUS
  Filled 2018-12-18 (×8): qty 50

## 2018-12-18 MED ORDER — SODIUM CHLORIDE 0.9% FLUSH: 3.0000 mL | INTRAVENOUS | Status: DC | PRN

## 2018-12-18 NOTE — Progress Notes (Signed)
[facility]  ROUNDING NOTE   Subjective:  Patient seen at bedside. Critically ill at the moment and hypotensive. She is still a bit agitated. Strong odor coming from the room.   Objective:  Vital signs in last 24 hours:  Temp:  [97.6 F (36.4 C)-99.1 F (37.3 C)] 97.6 F (36.4 C) (03/20 0800) Pulse Rate:  [80-107] 89 (03/20 0802) Resp:  [9-23] 10 (03/20 0802) BP: (55-124)/(33-97) 100/81 (03/20 0802) SpO2:  [99 %-100 %] 100 % (03/20 0802) Weight:  [180.3 kg] 180.3 kg (03/19 1420)  Weight change: -0.9 kg Filed Weights   12/17/18 1100 12/17/18 1315 12/17/18 1420  Weight: (!) 180.3 kg (!) 180.3 kg (!) 180.3 kg    Intake/Output: I/O last 3 completed shifts: In: 971 [I.V.:371.2; IV Piggyback:599.8] Out: 673 [Other:746]   Intake/Output this shift:  No intake/output data recorded.  Physical Exam: General: No acute distress  Head: Normocephalic, atraumatic. Moist oral mucosal membranes  Eyes: Anicteric  Neck: Supple, trachea midline  Lungs:  Clear to auscultation, normal effort  Heart: S1S2 no rubs  Abdomen:  Soft, nontender, bowel sounds present  Extremities: 1+ peripheral edema.  Neurologic: Agitated  Skin: No lesions  Access: L IJ PC removed, R femoral temporary dialysis catheter in place    Basic Metabolic Panel: Recent Labs  Lab 12/12/18 1219 12/26/2018 2017 12/09/2018 0439 12/17/18 1326 12/17/18 1702 12/18/18 0331  NA 137 140 140  --  139 140  K 4.2 4.1 4.7  --  4.0 4.0  CL 97* 97* 98  --  97* 99  CO2 23 24 22   --  24 25  GLUCOSE 105* 114* 59*  --  117* 102*  BUN 115* 135* 154*  --  92* 97*  CREATININE 8.81* 10.86* 11.04*  --  7.59* 8.31*  CALCIUM 8.2* 8.5* 8.6*  --  8.5* 8.9  PHOS  --   --   --  7.0* 5.0*  --     Liver Function Tests: Recent Labs  Lab 12/12/18 1219 12/10/2018 0439  AST 13* 15  ALT 7 10  ALKPHOS 81 77  BILITOT 0.8 0.9  PROT 6.3* 6.2*  ALBUMIN 2.2* 2.1*   Recent Labs  Lab 12/12/18 1219  LIPASE 21   No results  for input(s): AMMONIA in the last 168 hours.  CBC: Recent Labs  Lab 12/17/2018 2017 12/15/18 0923 12/12/2018 0439 12/17/18 1702 12/18/18 0331  WBC 9.7 5.8 5.5 10.3 6.9  NEUTROABS 8.0* 4.3  --   --  4.7  HGB 8.5* 7.8* 8.0* 8.7* 8.2*  HCT 28.3* 25.6* 26.9* 29.0* 27.7*  MCV 89.3 89.5 90.0 90.1 90.2  PLT 44* 37* 43* 46* 42*    Cardiac Enzymes: No results for input(s): CKTOTAL, CKMB, CKMBINDEX, TROPONINI in the last 168 hours.  BNP: Invalid input(s): POCBNP  CBG: Recent Labs  Lab 12/13/2018 1536 12/17/2018 1539 12/22/2018 1558 12/17/18 1411  GLUCAP 40* 45* 148* 58    Microbiology: Results for orders placed or performed during the hospital encounter of 12/08/2018  CULTURE, BLOOD (ROUTINE X 2) w Reflex to ID Panel     Status: Abnormal   Collection Time: 12/15/18  3:09 PM  Result Value Ref Range Status   Specimen Description   Final    BLOOD LEFT HAND Performed at [facility], [address] [place], [place] [address]    Special Requests   Final    AEROBIC BOTTLE ONLY Blood Culture results may not be optimal due to an inadequate volume of  blood received in culture bottles Performed at [facility], [address] [place], [place] [address]    Culture  Setup Time   Final    GRAM POSITIVE COCCI AEROBIC BOTTLE ONLY CRITICAL VALUE NOTED.  VALUE IS CONSISTENT WITH PREVIOUSLY REPORTED AND CALLED VALUE. Performed at [facility], [address] [place], [place] [address]    Culture (A)  Final    ENTEROCOCCUS FAECALIS SUSCEPTIBILITIES PERFORMED ON PREVIOUS CULTURE WITHIN THE LAST 5 DAYS. Performed at [facility], [place] [address] [place], [place] [address]    Report Status 12/18/2018 FINAL  Final  CULTURE, BLOOD (ROUTINE X 2) w Reflex to ID Panel     Status: Abnormal   Collection Time: 12/15/18  3:16 PM  Result Value Ref Range Status   Specimen Description   Final    BLOOD RIGHT HAND Performed at [facility], [address]  [place], [place] [address]    Special Requests   Final    BOTTLES DRAWN AEROBIC AND ANAEROBIC Blood Culture results may not be optimal due to an inadequate volume of blood received in culture bottles Performed at [facility], [address], [place], [place] [address]    Culture  Setup Time   Final    GRAM POSITIVE COCCI IN BOTH AEROBIC AND ANAEROBIC BOTTLES CRITICAL RESULT CALLED TO, READ BACK BY AND VERIFIED WITH: Vita Barley HALLHAI 12/06/2018 0448 REC Performed at [facility], [place] [address] [place],  [address]    Culture ENTEROCOCCUS FAECALIS (A)  Final   Report Status 12/18/2018 FINAL  Final   Organism ID, Bacteria ENTEROCOCCUS FAECALIS  Final      Susceptibility   Enterococcus faecalis - MIC*    AMPICILLIN <=2 SENSITIVE Sensitive     VANCOMYCIN 1 SENSITIVE Sensitive     GENTAMICIN SYNERGY SENSITIVE Sensitive     * ENTEROCOCCUS FAECALIS  Blood Culture ID Panel (Reflexed)     Status: Abnormal   Collection Time: 12/15/18  3:16 PM  Result Value Ref Range Status   Enterococcus species DETECTED (A) NOT DETECTED Final    Comment: CRITICAL RESULT CALLED TO, READ BACK BY AND VERIFIED WITH: SHEENA HALLHAI 12/22/2018 0448 REC    Vancomycin resistance NOT DETECTED NOT DETECTED Final   Listeria monocytogenes NOT DETECTED NOT DETECTED Final   Staphylococcus species NOT DETECTED NOT DETECTED Final   Staphylococcus aureus (BCID) NOT DETECTED NOT DETECTED Final   Streptococcus species NOT DETECTED NOT DETECTED Final   Streptococcus agalactiae NOT DETECTED NOT DETECTED Final   Streptococcus pneumoniae NOT DETECTED NOT DETECTED Final   Streptococcus pyogenes NOT DETECTED NOT DETECTED Final   Acinetobacter baumannii NOT DETECTED NOT DETECTED Final   Enterobacteriaceae species NOT DETECTED NOT DETECTED Final   Enterobacter cloacae complex NOT DETECTED NOT DETECTED Final   Escherichia coli NOT DETECTED NOT DETECTED Final   Klebsiella oxytoca NOT DETECTED NOT DETECTED Final    Klebsiella pneumoniae NOT DETECTED NOT DETECTED Final   Proteus species NOT DETECTED NOT DETECTED Final   Serratia marcescens NOT DETECTED NOT DETECTED Final   Haemophilus influenzae NOT DETECTED NOT DETECTED Final   Neisseria meningitidis NOT DETECTED NOT DETECTED Final   Pseudomonas aeruginosa NOT DETECTED NOT DETECTED Final   Candida albicans NOT DETECTED NOT DETECTED Final   Candida glabrata NOT DETECTED NOT DETECTED Final   Candida krusei NOT DETECTED NOT DETECTED Final   Candida parapsilosis NOT DETECTED NOT DETECTED Final   Candida tropicalis NOT DETECTED NOT DETECTED Final  Comment: Performed at [facility], [address] [place], [place] [address]  Surgical PCR screen     Status: None   Collection Time: 12/22/2018  4:49 AM  Result Value Ref Range Status   MRSA, PCR NEGATIVE NEGATIVE Final   Staphylococcus aureus NEGATIVE NEGATIVE Final    Comment: (NOTE) The Xpert SA Assay (FDA approved for NASAL specimens in patients 53 years of age and older), is one component of a comprehensive surveillance program. It is not intended to diagnose infection nor to guide or monitor treatment. Performed at [facility], [address], [place], [place] [address]   Cath Tip Culture     Status: None (Preliminary result)   Collection Time: 11/29/2018  4:38 PM  Result Value Ref Range Status   Specimen Description   Final    CATH TIP Performed at [facility], [address] [place], [place] [address]    Special Requests   Final    NONE Performed at [facility], [address], [place], [place] [address]    Culture   Final    CULTURE REINCUBATED FOR BETTER GROWTH Performed at [facility], [place] [address], [place], [place] [address]    Report Status PENDING  Incomplete  C difficile quick scan w PCR reflex     Status: None   Collection Time: 12/17/18  6:25 AM  Result Value Ref Range Status   C Diff antigen NEGATIVE NEGATIVE Final   C Diff  toxin NEGATIVE NEGATIVE Final   C Diff interpretation No C. difficile detected.  Final    Comment: Performed at [facility], [address] [place], [place] [address]  CULTURE, BLOOD (ROUTINE X 2) w Reflex to ID Panel     Status: None (Preliminary result)   Collection Time: 12/17/18  5:02 PM  Result Value Ref Range Status   Specimen Description BLOOD PORTA CATH  Final   Special Requests   Final    BOTTLES DRAWN AEROBIC AND ANAEROBIC Blood Culture adequate volume   Culture   Final    NO GROWTH < 12 HOURS Performed at [facility], [address] [place], [place] [address]    Report Status PENDING  Incomplete  CULTURE, BLOOD (ROUTINE X 2) w Reflex to ID Panel     Status: None (Preliminary result)   Collection Time: 12/18/18  3:30 AM  Result Value Ref Range Status   Specimen Description BLOOD RIGHT ANTECUBITAL  Final   Special Requests   Final    BOTTLES DRAWN AEROBIC AND ANAEROBIC Blood Culture results may not be optimal due to an excessive volume of blood received in culture bottles   Culture   Final    NO GROWTH <12 HOURS Performed at [facility], [address] [place],  [address]    Report Status PENDING  Incomplete    Coagulation Studies: No results for input(s): LABPROT, INR in the last 72 hours.  Urinalysis: No results for input(s): COLORURINE, LABSPEC, PHURINE, GLUCOSEU, HGBUR, BILIRUBINUR, KETONESUR, PROTEINUR, UROBILINOGEN, NITRITE, LEUKOCYTESUR in the last 72 hours.  Invalid input(s): APPERANCEUR    Imaging: No results found.   Medications:   . sodium chloride Stopped (12/12/2018 0834)  . sodium chloride    . sodium chloride    . sodium chloride    . sodium chloride    . ampicillin (OMNIPEN) IV Stopped (12/18/18 0529)  . cefTRIAXone (ROCEPHIN)  IV Stopped (12/17/18 2350)  . norepinephrine (LEVOPHED) Adult infusion 2  mcg/min (12/18/18 7829)  . sodium chloride     . buPROPion  300 mg Oral Daily  . Chlorhexidine  Gluconate Cloth  6 each Topical Q0600  . cinacalcet  30 mg Oral Q breakfast  . docusate sodium  100 mg Oral Daily  . epoetin (EPOGEN/PROCRIT) injection  10,000 Units Intravenous Q M,W,F-HD  . feeding supplement (NEPRO CARB STEADY)  237 mL Oral BID BM  . ferric citrate  420 mg Oral TID  . lactobacillus  1 g Oral TID WC  . levothyroxine  75 mcg Oral QAC breakfast  . midodrine  5 mg Oral TID WC  . multivitamin  1 tablet Oral QHS  . pantoprazole  40 mg Oral Daily  . pregabalin  100 mg Oral BID  . sevelamer carbonate  1,600 mg Oral TID WC   sodium chloride, sodium chloride, sodium chloride, acetaminophen **OR** acetaminophen, alteplase, heparin, HYDROcodone-acetaminophen, HYDROmorphone (DILAUDID) injection, HYDROmorphone (DILAUDID) injection, iohexol, lidocaine (PF), lidocaine-prilocaine, meclizine, ondansetron (ZOFRAN) IV, ondansetron (ZOFRAN) IV, ondansetron **OR** [DISCONTINUED] ondansetron (ZOFRAN) IV, pentafluoroprop-tetrafluoroeth, polyethylene glycol  Assessment/ Plan:  48 y.o. female with past medical history of ESRD on HD, morbid obesity, diabetes mellitus type 2, hypertension, anemia of chronic kidney disease admitted with non functional permcath and sepsis.  [facility]/MWF2/EDW 176.5  1.  ESRD on HD MWF.  Patient due for hemodialysis today.  We have prepared orders.  No ultrafiltration given hypotension.  I have advised critical care nursing to increase pressors to maintain a map of 65 or greater.  2.  Anemia of chronic kidney disease/thrombocytopenia.  Hemoglobin currently 8.2.  Continue to monitor.  Maintain the patient on Epogen.  3.  Secondary hyperparathyroidism.  Phosphorus down to 5.0.  Continue to monitor closely.  4.  Sepsis/hypotension.  Enterococcus noted to be growing in the blood.  Antibiotic management as per hospitalist.  Maintain pressors to maintain a map of 65 or greater.  5.  Overall prognosis quite guarded.   LOS: 4 Lindsey Russell 3/20/20209:53 AM

## 2018-12-18 NOTE — Progress Notes (Signed)
   Date of Admission:  11/30/2018     ID: Lindsey Russell is a 48 y.o. female  Active Problems:   ESRD (end stage renal disease) on dialysis ([place]) Enterococcus bacteremia Metabolic Encephalopathy- OSA Thyroid disease   Subjective: Lethargic Opens eyes to calling  On pressor Medications:  . buPROPion  300 mg Oral Daily  . Chlorhexidine Gluconate Cloth  6 each Topical Q0600  . cinacalcet  30 mg Oral Q breakfast  . docusate sodium  100 mg Oral Daily  . epoetin (EPOGEN/PROCRIT) injection  10,000 Units Intravenous Q M,W,F-HD  . feeding supplement (NEPRO CARB STEADY)  237 mL Oral BID BM  . ferric citrate  420 mg Oral TID  . lactobacillus  1 g Oral TID WC  . levothyroxine  75 mcg Oral QAC breakfast  . midodrine  5 mg Oral TID WC  . multivitamin  1 tablet Oral QHS  . pantoprazole (PROTONIX) IV  40 mg Intravenous QHS  . sevelamer carbonate  1,600 mg Oral TID WC  . sodium chloride flush  3 mL Intravenous Q12H    Objective: Vital signs in last 24 hours: Temp:  [97.4 F (36.3 C)-98.1 F (36.7 C)] 97.4 F (36.3 C) (03/20 1530) Pulse Rate:  [80-99] 93 (03/20 1700) Resp:  [9-23] 23 (03/20 1700) BP: (77-124)/(46-97) 100/73 (03/20 1700) SpO2:  [85 %-100 %] 85 % (03/20 1700)  PHYSICAL EXAM:  General:letahrgic Lungs:b/l air entry Heart: Regular rate and rhythm, no murmur, rub or gallop. Abdomen: Soft, non-tender,not distended. Bowel sounds normal. No masses Extremities: atraumatic, no cyanosis. No edema. No clubbing Skin: No rashes or lesions. Or bruising Lymph: Cervical, supraclavicular normal. Neurologic: Grossly non-focal  Lab Results CBC Latest Ref Rng & Units 12/18/2018 12/17/2018 12/27/2018  WBC 4.0 - 10.5 K/uL 6.9 10.3 5.5  Hemoglobin 12.0 - 15.0 g/dL 8.2(L) 8.7(L) 8.0(L)  Hematocrit 36.0 - 46.0 % 27.7(L) 29.0(L) 26.9(L)  Platelets 150 - 400 K/uL 42(L) 46(L) 43(L)   Recent Labs    12/17/18 1702 12/18/18 0331  WBC 10.3 6.9  HGB 8.7* 8.2*  HCT 29.0* 27.7*  NA 139 140   K 4.0 4.0  CL 97* 99  CO2 24 25  BUN 92* 97*  CREATININE 7.59* 8.31*   Liver Panel Recent Labs    12/26/2018 0439  PROT 6.2*  ALBUMIN 2.1*  AST 15  ALT 10  ALKPHOS 77  BILITOT 0.9   Sedimentation Rate No results for input(s): ESRSEDRATE in the last 72 hours. C-Reactive Protein No results for input(s): CRP in the last 72 hours.  Microbiology:  Studies/Results: No results found.   Assessment/Plan: Enterococcus bacteremia-on ampicillin and ceftriaxone- being treated like endocarditis- TEE needed to r/o endocarditis  Also has been c/o rt hip pain for the past month- will need some kind of imaging MRI/CT but her weight precludes those tests  ESRD poor access- currently getting dialyzed   Encephalopathy- metabolic ID will follow her peripherally this weekend

## 2018-12-18 NOTE — Progress Notes (Addendum)
Daily Progress Note   Patient Name: Lindsey Russell       Date: 12/18/2018 DOB: [birth year]  Age: 48 y.o. MRN#: [id number] Attending Physician: Gorden Harms, MD Primary Care Physician: Marco Collie, MD Admit Date: 12/13/2018  Reason for Consultation/Follow-up: Establishing goals of care  Subjective: Patient is resting in bed. She does not want the nurse to move her arm to replace the blood pressure cuff. She does not speak initially when spoken to, only speaking out to ask staff not to touch her, and then only speaks in 1 word answers or in few word phrases. She states " I want all my stuff", when attempting to discuss [place]. She cannot tell me where she is despite reorientation minutes earlier.    Spoke with her husband via phone. He states he has not had a ride to the hospital. He states he has been updated regularly by the medical team.   Inquired if there is an H-POA. He is very clear he is the decision maker for his wife, and does not want anyone else making decisions about her care. He states he is fine with other family members including the patient's daughter (his 46) having updates, but they may not make decisions.   We discussed GOC. He states she has said in the past she wanted to give up, but she always changed her mind, and adds he will not give up on her. He wants all available care that she will allow, as he understands she is resistant to some of the care. He states he will need to talk to his wife if possible, and states he will honor her wishes. He plans to come Sunday.    Length of Stay: 4  Current Medications: Scheduled Meds:  . buPROPion  300 mg Oral Daily  . Chlorhexidine Gluconate Cloth  6 each Topical Q0600  . cinacalcet  30 mg Oral Q breakfast  . docusate  sodium  100 mg Oral Daily  . epoetin (EPOGEN/PROCRIT) injection  10,000 Units Intravenous Q M,W,F-HD  . feeding supplement (NEPRO CARB STEADY)  237 mL Oral BID BM  . ferric citrate  420 mg Oral TID  . lactobacillus  1 g Oral TID WC  . levothyroxine  75 mcg Oral QAC breakfast  . midodrine  5 mg Oral TID WC  .  multivitamin  1 tablet Oral QHS  . pantoprazole (PROTONIX) IV  40 mg Intravenous QHS  . sevelamer carbonate  1,600 mg Oral TID WC  . sodium chloride flush  3 mL Intravenous Q12H    Continuous Infusions: . sodium chloride Stopped (11/30/2018 0834)  . sodium chloride    . sodium chloride    . sodium chloride    . sodium chloride    . sodium chloride    . ampicillin (OMNIPEN) IV Stopped (12/18/18 0529)  . argatroban    . cefTRIAXone (ROCEPHIN)  IV 2 g (12/18/18 1132)  . norepinephrine (LEVOPHED) Adult infusion 2 mcg/min (12/18/18 5320)  . sodium chloride      PRN Meds: sodium chloride, sodium chloride, sodium chloride, sodium chloride, acetaminophen **OR** acetaminophen, alteplase, heparin, HYDROcodone-acetaminophen, HYDROmorphone (DILAUDID) injection, HYDROmorphone (DILAUDID) injection, iohexol, lidocaine (PF), lidocaine-prilocaine, meclizine, ondansetron (ZOFRAN) IV, ondansetron (ZOFRAN) IV, ondansetron **OR** [DISCONTINUED] ondansetron (ZOFRAN) IV, pentafluoroprop-tetrafluoroeth, polyethylene glycol, sodium chloride flush  Physical Exam Pulmonary:     Effort: Pulmonary effort is normal.  Neurological:     Mental Status: She is alert.             Vital Signs: BP (!) 96/48   Pulse 81   Temp 97.6 F (36.4 C) (Oral)   Resp 11   Ht 5\' 4"  (1.626 m)   Wt (!) 180.3 kg   SpO2 100%   BMI 68.23 kg/m  SpO2: SpO2: 100 % O2 Device: O2 Device: Room Air O2 Flow Rate: O2 Flow Rate (L/min): 2 L/min  Intake/output summary:   Intake/Output Summary (Last 24 hours) at 12/18/2018 1200 Last data filed at 12/18/2018 2334 Gross per 24 hour  Intake 971.03 ml  Output -390 ml  Net  1361.03 ml   LBM: Last BM Date: 12/17/18 Baseline Weight: Weight: (!) 167.8 kg Most recent weight: Weight: (!) 180.3 kg       Palliative Assessment/Data:    Flowsheet Rows     Most Recent Value  Intake Tab  Referral Department  Hospitalist  Unit at Time of Referral  Med/Surg Unit  Date Notified  12/05/2018  Palliative Care Type  New Palliative care  Reason for referral  Clarify Goals of Care  Date of Admission  12/10/2018  # of days IP prior to Palliative referral  2  Clinical Assessment  Psychosocial & Spiritual Assessment  Palliative Care Outcomes      Patient Active Problem List   Diagnosis Date Noted  . ESRD (end stage renal disease) on dialysis ([place]) 12/03/2018    Palliative Care Assessment & Plan   Recommendations/Plan:  CCM, husband to come Sunday if you would like to speak with him in person. Palliative medicine is not available on the weekend.   Husband is the legal next of kin decision maker for the patient, and he does not want this deferred to other family members.    Goals of Care and Additional Recommendations:  Limitations on Scope of Treatment: None  Code Status:    Code Status Orders  (From admission, onward)         Start     Ordered   12/15/18 0047  Full code  Continuous     03 /17/20 0046        Code Status History    This patient has a current code status but no historical code status.       Prognosis:  Poor   Care plan was discussed with RN  Thank you for allowing the Palliative Medicine  Team to assist in the care of this patient.   Total Time 40 min Prolonged Time Billed  no      Greater than 50%  of this time was spent counseling and coordinating care related to the above assessment and plan.  Asencion Gowda, NP  Please contact Palliative Medicine Team phone at [phone] for questions and concerns.

## 2018-12-18 NOTE — Progress Notes (Addendum)
[facility] [place] NAME: Lindsey Russell    MR#:  [id number]  DATE OF BIRTH:  22-Feb-1971  SUBJECTIVE:  CHIEF COMPLAINT:   Chief Complaint  Patient presents with  . Vascular Access Problem  Case discussed with intensivist, patient remains quite ill, nephrology/infectious disease input appreciated, on Levophed for continued hypotension, for hemodialysis later today REVIEW OF SYSTEMS:  CONSTITUTIONAL: No fever, fatigue or weakness.  EYES: No blurred or double vision.  EARS, NOSE, AND THROAT: No tinnitus or ear pain.  RESPIRATORY: No cough, shortness of breath, wheezing or hemoptysis.  CARDIOVASCULAR: No chest pain, orthopnea, edema.  GASTROINTESTINAL: No nausea, vomiting, diarrhea or abdominal pain.  GENITOURINARY: No dysuria, hematuria.  ENDOCRINE: No polyuria, nocturia,  HEMATOLOGY: No anemia, easy bruising or bleeding SKIN: No rash or lesion. MUSCULOSKELETAL: No joint pain or arthritis.   NEUROLOGIC: No tingling, numbness, weakness.  PSYCHIATRY: No anxiety or depression.   ROS  DRUG ALLERGIES:   Allergies  Allergen Reactions  . Morphine And Related   . Naproxen     VITALS:  Blood pressure (!) 96/48, pulse 81, temperature 97.6 F (36.4 C), temperature source Oral, resp. rate 11, height 5\' 4"  (1.626 m), weight (!) 180.3 kg, SpO2 100 %.  PHYSICAL EXAMINATION:  GENERAL:  48 y.o.-year-old patient lying in the bed with no acute distress.  EYES: Pupils equal, round, reactive to light and accommodation. No scleral icterus. Extraocular muscles intact.  HEENT: Head atraumatic, normocephalic. Oropharynx and nasopharynx clear.  NECK:  Supple, no jugular venous distention. No thyroid enlargement, no tenderness.  LUNGS: Normal breath sounds bilaterally, no wheezing, rales,rhonchi or crepitation. No use of accessory muscles of respiration.  CARDIOVASCULAR: S1, S2 normal. No murmurs, rubs, or gallops.  ABDOMEN: Soft, nontender, nondistended.  Bowel sounds present. No organomegaly or mass.  EXTREMITIES: No pedal edema, cyanosis, or clubbing.  NEUROLOGIC: Cranial nerves II through XII are intact. Muscle strength 5/5 in all extremities. Sensation intact. Gait not checked.  PSYCHIATRIC: The patient is alert and oriented x 3.  SKIN: No obvious rash, lesion, or ulcer.   Physical Exam LABORATORY PANEL:   CBC Recent Labs  Lab 12/18/18 0331  WBC 6.9  HGB 8.2*  HCT 27.7*  PLT 42*   ------------------------------------------------------------------------------------------------------------------  Chemistries  Recent Labs  Lab 12/05/2018 0439  12/18/18 0331  NA 140   < > 140  K 4.7   < > 4.0  CL 98   < > 99  CO2 22   < > 25  GLUCOSE 59*   < > 102*  BUN 154*   < > 97*  CREATININE 11.04*   < > 8.31*  CALCIUM 8.6*   < > 8.9  AST 15  --   --   ALT 10  --   --   ALKPHOS 77  --   --   BILITOT 0.9  --   --    < > = values in this interval not displayed.   ------------------------------------------------------------------------------------------------------------------  Cardiac Enzymes No results for input(s): TROPONINI in the last 168 hours. ------------------------------------------------------------------------------------------------------------------  RADIOLOGY:  No results found.  ASSESSMENT AND PLAN:  *Acute uremia  Stable Secondary to ESRD, extended period of time without hemodialysis Discussed with the patient's husband- would like for everything to be done to ensure dialysis is continued and underlying infection is cured, status post left IJ permacath removal and right femoral hemodialysis temporary catheter placement on December 16, 2018 by vascular surgery  *Acute enterococcal bacteremia  Suspected due acute sepsis- infected hemodialysis catheter-left IJ permacatheter removed December 16, 2018 by vascular surgery versus spinal/hip infectious process-for CT of the abdomen/pelvis for further evaluation  Infectious  disease input appreciated-for CT of the abdomen/pelvis for possible infectious source-concern for chronic back/hip pain, echo and cardiogram was unimpressive, cardiology consulted for TEE for further investigation of possible endocarditis Continue empiric ampicillin/Rocephin per ID recommendations, follow-up on outstanding cultures/sensitivities  *Acute on chronic hypotension Suspected due to acute enterococcal sepsis Patient is noncompliant with medication administration, has refused Midodrine several times during her hospital course, patient transferred to ICU on December 17, 2018 for pressor support-on Levophed  *Chronic  ESRD Stable Nephrology following, for hemodialysis later today, Levophed for pressor support during treatments  *Acute diarrhea  Improved C. 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 Stable Continue local wound care Barrier cream recommended by home health nurse  *Chronic noncompliance of medical management The importance of compliance was strongly encouraged to the patient during her stay, patient has refused meds/treatment/being touched several times during her hospital stay, the patient's husband has been the decision maker given her acute waxing and waning mental status  Disposition pending clinical course, case discussed with the patient's husband with all questions answered given the patient's acute uremia, palliative care consulted  All the records are reviewed and case discussed with  Care Management/Social Workerr. Management plans discussed with the patient, family and they are in agreement.  CODE STATUS: full  TOTAL TIME TAKING CARE OF THIS PATIENT: 40 minutes.     POSSIBLE D/C IN 3-5 DAYS, DEPENDING ON CLINICAL CONDITION.   Lindsey Russell Salary M.D on 12/18/2018   Between 7am to 6pm - Pager - [phone]  After 6pm go to [email or web address] - password [email or web address] [facility]  Office  [phone]  CC: Primary care physician; Marco Collie, MD  Note: This dictation was prepared with Dragon dictation along with smaller phrase technology. Any transcriptional errors that result from this process are unintentional.

## 2018-12-18 NOTE — Progress Notes (Signed)
Patient spoke with Mr Cumbie on phone and daughter Altamese Dilling. Family was trying to help staff with explaining the importance of allowing [place] to care for her. Patient is still refusing to be turned and bathed. Explained the importance of cleaning her up and looking at her skin. At this point patient states "leave me alone". Unable to assess patients back side/skin, and rectal tube. She has put out 250 cc of stool.

## 2018-12-18 NOTE — Progress Notes (Addendum)
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  Present during meeting were 6 family members including daughter, mother sisters.  Husband current POA has not been here to visit patient and minimally active and decision-making, daughter states husband has asked her to participate on his behalf.  Have documented their contact information EMR.  Currently full code. -levophed 47mcg due to shock.   PAST MEDICAL HISTORY   Past Medical History:  Diagnosis Date  . Diabetes mellitus without complication ([place])   . Hyperkalemia   . Renal disorder    end stage renal disease  . Sleep apnea   . Thyroid disease      SURGICAL HISTORY   Past Surgical History:  Procedure Laterality Date  . CESAREAN SECTION     x 2  . DIALYSIS/PERMA CATHETER INSERTION N/A 12/02/2018   Procedure: DIALYSIS/PERMA CATHETER INSERTION;  Surgeon: Algernon Huxley, MD;  Location: [facility];  Service: Cardiovascular;  Laterality: N/A;  . TEMPORARY DIALYSIS CATHETER N/A 12/25/2018   Procedure: TEMPORARY DIALYSIS CATHETER PLACEMENT WITH SEDATION;  Surgeon: Algernon Huxley, MD;  Location: [facility];  Service: Cardiovascular;  Laterality: N/A;     FAMILY HISTORY   History reviewed. No pertinent family history.   SOCIAL HISTORY   Social History   Tobacco Use  . Smoking status: Never Smoker  . Smokeless tobacco: Never Used  Substance Use Topics  . Alcohol use: Never   Frequency: Never  . Drug use: Never     MEDICATIONS   Current Medication:  Current Facility-Administered Medications:  .  0.9 %  sodium chloride infusion, , Intravenous, PRN, Algernon Huxley, MD, Stopped at 12/18/2018 [phone] .  0.9 %  sodium chloride infusion, 100 mL, Intravenous, PRN, Lateef, Munsoor, MD .  0.9 %  sodium chloride infusion, 100 mL, Intravenous, PRN, Lateef, Munsoor, MD .  0.9 %  sodium chloride infusion, , Intravenous, Continuous, Salary, Montell D, MD .  0.9 %  sodium chloride infusion, 250 mL, Intravenous, Continuous, Salary, Montell D, MD .  acetaminophen (TYLENOL) tablet 650 mg, 650 mg, Oral, Q6H PRN, 650 mg at 12/17/18 0721 **OR** acetaminophen (TYLENOL) suppository 650 mg, 650 mg, Rectal, Q6H PRN, Lucky Cowboy, Erskine Squibb, MD .  alteplase (CATHFLO ACTIVASE) injection 2 mg, 2 mg, Intracatheter, Once PRN, Lateef, Munsoor, MD .  ampicillin (OMNIPEN) 2 g in sodium chloride 0.9 % 100 mL IVPB, 2 g, Intravenous, Q12H, Dew, Erskine Squibb, MD, Stopped at 12/18/18 0529 .  buPROPion (WELLBUTRIN XL) 24 hr tablet 300 mg, 300 mg, Oral, Daily, Dew, Erskine Squibb, MD, 300 mg at 12/15/18 0100 .  cefTRIAXone (ROCEPHIN) 2 g in sodium chloride 0.9 % 100 mL IVPB, 2 g, Intravenous, Q12H, Tsosie Billing, MD, Stopped at 12/17/18 2350 .  Chlorhexidine Gluconate Cloth 2 % PADS 6 each, 6 each, Topical, Q0600, Algernon Huxley, MD, 6 each at 12/17/18 0630 .  cinacalcet (SENSIPAR) tablet 30 mg, 30 mg, Oral, Q breakfast, Dew, Erskine Squibb, MD .  docusate sodium (COLACE) capsule 100 mg, 100 mg, Oral, Daily, Dew, Erskine Squibb, MD .  epoetin alfa (EPOGEN,PROCRIT) injection 10,000 Units, 10,000 Units,  Intravenous, Q M,W,F-HD, Algernon Huxley, MD, 10,000 Units at 12/17/18 1240 .  feeding supplement (NEPRO CARB STEADY) liquid 237 mL, 237 mL, Oral, BID BM, Salary, Montell D, MD .  ferric citrate (AURYXIA) tablet 420 mg, 420 mg, Oral, TID, Dew, Erskine Squibb, MD .  heparin injection 1,000 Units, 1,000 Units, Dialysis, PRN, Holley Raring, Munsoor, MD, 1,000  Units at 12/17/18 1334 .  HYDROcodone-acetaminophen (NORCO/VICODIN) 5-325 MG per tablet 1 tablet, 1 tablet, Oral, Q6H PRN, Algernon Huxley, MD, 1 tablet at 11/30/2018 1728 .  HYDROmorphone (DILAUDID) injection 0.5 mg, 0.5 mg, Intravenous, Q8H PRN, Algernon Huxley, MD, 0.5 mg at 12/15/18 1423 .  HYDROmorphone (DILAUDID) injection 1 mg, 1 mg, Intravenous, Once PRN, Lucky Cowboy, Erskine Squibb, MD .  iohexol (OMNIPAQUE) 300 MG/ML solution 125 mL, 125 mL, Intravenous, Once PRN, Salary, Montell D, MD .  lactobacillus (FLORANEX/LACTINEX) granules 1 g, 1 g, Oral, TID WC, Dew, Erskine Squibb, MD .  levothyroxine (SYNTHROID, LEVOTHROID) tablet 75 mcg, 75 mcg, Oral, QAC breakfast, Algernon Huxley, MD, 75 mcg at 12/15/18 1135 .  lidocaine (PF) (XYLOCAINE) 1 % injection 5 mL, 5 mL, Intradermal, PRN, Lateef, Munsoor, MD .  lidocaine-prilocaine (EMLA) cream 1 application, 1 application, Topical, PRN, Lateef, Munsoor, MD .  meclizine (ANTIVERT) tablet 25 mg, 25 mg, Oral, BID PRN, Algernon Huxley, MD .  midodrine (PROAMATINE) tablet 5 mg, 5 mg, Oral, TID WC, Dew, Erskine Squibb, MD, 5 mg at 12/17/18 1240 .  multivitamin (RENA-VIT) tablet 1 tablet, 1 tablet, Oral, QHS, Salary, Montell D, MD .  norepinephrine (LEVOPHED) 4 mg in dextrose 5 % 250 mL (0.016 mg/mL) infusion, 2-10 mcg/min, Intravenous, Titrated, Salary, Montell D, MD, Last Rate: 7.5 mL/hr at 12/18/18 0637, 2 mcg/min at 12/18/18 0637 .  ondansetron (ZOFRAN) injection 4 mg, 4 mg, Intravenous, Q6H PRN, Lucky Cowboy, Erskine Squibb, MD .  ondansetron (ZOFRAN) injection 4 mg, 4 mg, Intravenous, Q6H PRN, Lucky Cowboy, Erskine Squibb, MD .  ondansetron (ZOFRAN) tablet 4 mg, 4 mg, Oral, Q6H PRN **OR** [DISCONTINUED] ondansetron (ZOFRAN) injection 4 mg, 4 mg, Intravenous, Q6H PRN, Algernon Huxley, MD, 4 mg at 12/15/18 1256 .  pantoprazole (PROTONIX) EC tablet 40 mg, 40 mg, Oral, Daily, Dew, Erskine Squibb, MD, 40 mg at 12/15/18 1136 .  pentafluoroprop-tetrafluoroeth (GEBAUERS) aerosol 1 application, 1 application, Topical, PRN, Lateef, Munsoor, MD  .  polyethylene glycol (MIRALAX / GLYCOLAX) packet 17 g, 17 g, Oral, Daily PRN, Algernon Huxley, MD .  pregabalin (LYRICA) capsule 100 mg, 100 mg, Oral, BID, Dew, Erskine Squibb, MD, 100 mg at 12/15/18 2115 .  sevelamer carbonate (RENVELA) tablet 1,600 mg, 1,600 mg, Oral, TID WC, Algernon Huxley, MD, 1,600 mg at 12/15/18 1624 .  sodium chloride 0.9 % bolus 1,000 mL, 1,000 mL, Intravenous, Once, Salary, Montell D, MD    ALLERGIES   Morphine and related and Naproxen    REVIEW OF SYSTEMS    Unable to obtain due to acutely ill state  PHYSICAL EXAMINATION   Vitals:   12/18/18 0800 12/18/18 0802  BP: 100/81 100/81  Pulse: 90 89  Resp:  10  Temp: 36.4 C   SpO2:  100%    GENERAL: Mild distress with encephalopathy HEAD: Normocephalic, atraumatic.  EYES: Pupils equal, round, reactive to light.  No scleral icterus.  MOUTH: Moist mucosal membrane. NECK: Supple. No thyromegaly. No nodules. No JVD.  PULMONARY: Decreased breath sounds bilaterally CARDIOVASCULAR: S1 and S2. Regular rate and rhythm. No murmurs, rubs, or gallops.  GASTROINTESTINAL: Soft, nontender,  non-distended. No masses. Positive bowel sounds. No hepatosplenomegaly.  MUSCULOSKELETAL: No swelling, clubbing, or edema.  NEUROLOGIC: Mild distress due to acute illness SKIN:intact,warm,dry   LABS AND IMAGING     LAB RESULTS: Recent Labs  Lab 12/03/2018 0439 12/17/18 1702 12/18/18 0331  NA 140 139 140  K 4.7 4.0 4.0  CL 98 97* 99  CO2 22 24 25   BUN 154* 92* 97*  CREATININE 11.04* 7.59* 8.31*  GLUCOSE 59* 117* 102*   Recent Labs  Lab 12/11/2018 0439 12/17/18 1702 12/18/18 0331  HGB 8.0* 8.7* 8.2*  HCT 26.9* 29.0* 27.7*  WBC 5.5 10.3 6.9  PLT 43* 46* 42*     IMAGING RESULTS: No results found.    ASSESSMENT AND PLAN   -Multidisciplinary rounds held today    CARDIAC FAILURE- -hypotension- possibly due to sepsis vs hypothyroidsim -Currently on vasopressor support with Levophed - conservative fluids and  levothyroxine.  -oxygen as needed -follow up cardiac enzymes as indicated ICU monitoring   Renal Failure-ESRD on HD -appreciate nephrology input-for HD today -follow chem 7 -follow UO    NEUROLOGY -encephalopathy - uremia vs septic  -on HD and IV antibiotcs   Sepsis with enterococcus bacteremia    - on ampicillin   -random cortisol-no adrenal insufficiency at this time  - fibrinogen for poss DIC-reference range    Thrombocytopenia    - possibly due to sepsis vs HITs    -high risk for heparin antibody induced due to recurrent heparin exposure - serotonin release pending-we will use Argatroban in the interim   -peripheral blood film for chistocytes   ID -continue IV abx as prescibed -follow up cultures   GI/Nutrition GI PROPHYLAXIS as indicated DIET-->TF's as tolerated Constipation protocol as indicated  ENDO - ICU hypoglycemic\Hyperglycemia protocol -check FSBS per protocol   ELECTROLYTES -follow labs as needed -replace as needed -pharmacy consultation   DVT/GI PRX ordered -SCDs  TRANSFUSIONS AS NEEDED MONITOR FSBS ASSESS the need for LABS as needed   Critical care provider statement:   Critical care time (minutes): 120  Critical care time was exclusive of: Separately billable procedures and treating other patients  Critical care was necessary to treat or prevent imminent or life-threatening deterioration of the following conditions:  Encephalopathy, sepsis with bacteremia, hypothyroidism, sacral wound, super morbid obesity, diabetes mellitus, hypertension, thrombocytopenia, ESRD, anemia  Critical care was time spent personally by me on the following activities: Development of treatment plan with patient or surrogate, discussions with consultants, evaluation of patient's response to treatment, examination of patient, obtaining history from patient or surrogate, ordering and performing treatments and interventions, ordering and  review of laboratory studies and re-evaluation of patient's condition.  I assumed direction of critical care for this patient from another provider in my specialty: no    This document was prepared using Dragon voice recognition software and may include unintentional dictation errors.    Ottie Glazier, M.D.  Division of [facility]

## 2018-12-18 NOTE — Progress Notes (Signed)
Patient unable to tolerate po safely. Dr Delilah Shan. Aware. No additional orders given at this time.

## 2018-12-18 NOTE — TOC Progression Note (Signed)
Transition of Care [facility]) - Progression Note    Patient Details  Name: Lindsey Russell MRN: [id number] Date of Birth: July 17, 1971  Transition of Care [facility]) CM/SW Contact  Shela Leff, [place] Phone Number: 12/18/2018, 2:59 PM  Clinical Narrative:   Claiborne Billings at [facility] informed CSW that patient's pasrr expires tomorrow. CSW submitted a request to renew the pasrr. Pasrr is pending at this time.       Barriers to Discharge: Continued Medical Work up  Expected Discharge Plan and [place] arrangements for the past 2 months: [place] Expected Discharge Date: 12/18/18                         Social Determinants of Health (SDOH) Interventions    Readmission Risk Interventions Readmission Risk Prevention Plan 12/20/2018  Transportation Screening Complete  HRI or [place] (No Data)

## 2018-12-18 NOTE — Progress Notes (Signed)
Left message for Mr. Kat[place] regarding patients refusal to be turned and and bathed. Patient states "leave me alone". Several attempts made. Dr. Cristie Hem made aware of situation. Explained to patient that she might be sitting in feces and we need to reposition her. Due to pressure ulcers and hygiene. Patient has been refusing this RN, aide, and nursing student. Husband unable to come to the hospital. He will call her phone in her room to convince her to allow [place] to care for her.

## 2018-12-18 NOTE — Progress Notes (Signed)
Hemodialysis treatment completed at 2014. Net UF removed 0. Blood rinsed back, CVC flushed  and locked with NS. Red caps replaced. Dressing remains intact and insertion site remains without signs and symptoms of infection.  Post hemodialysis report given to T. Rice, RN.    12/18/18 2104  Hand-Off documentation  Report given to (Full Name) Latrelle Dodrill, RN  Vital Signs  Temp 98 F (36.7 C)  Temp Source Oral  Pulse Rate 98  Resp (!) 21  BP (!) 52/32  BP Location Left Arm  Oxygen Therapy  SpO2 97 %  During Hemodialysis Assessment  Intra-Hemodialysis Comments Tx completed  Post-Hemodialysis Assessment  Rinseback Volume (mL) 250 mL  KECN 71.3 V  Dialyzer Clearance Lightly streaked  Duration of HD Treatment -hour(s) 3.5 hour(s)  Hemodialysis Intake (mL) 500 mL  UF Total -Machine (mL) 500 mL  Net UF (mL) 0 mL  Tolerated HD Treatment Yes  Education / Care Plan  Dialysis Education Provided Yes  Documented Education in Care Plan Yes  Hemodialysis Catheter Right Femoral vein Triple-lumen;Temporary  Placement Date: (c) 12/09/2018   Placed prior to admission: No  Orientation: Right  Access Location: Femoral vein  Hemodialysis Catheter Type: Triple-lumen;Temporary  Site Condition No complications  Blue Lumen Status Saline locked  Red Lumen Status Saline locked

## 2018-12-18 NOTE — Progress Notes (Signed)
ICU MD Dr. Cristie Hem aware that patient is confused/intermit garbling. Pupils bilaterally 5 and sluggish. At this point no additional orders.

## 2018-12-18 NOTE — Progress Notes (Signed)
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 Heparin     R/O HIT; initiated 12/17/28  . Morphine And Related   . Naproxen     Patient Measurements: Height: 5\' 4"  (162.6 cm) Weight: (!) 397 lb 7.8 oz (180.3 kg) IBW/kg (Calculated) : 54.7  Vital Signs: Temp: 97.7 F (36.5 C) (03/20 1724) Temp Source: Oral (03/20 1724) BP: 96/64 (03/20 1945) Pulse Rate: 92 (03/20 1945)  Labs: Recent Labs    12/13/2018 0439 12/17/18 1702 12/18/18 0331 12/18/18 1518 12/18/18 1839  HGB 8.0* 8.7* 8.2*  --   --   HCT 26.9* 29.0* 27.7*  --   --   PLT 43* 46* 42*  --   --   APTT  --   --   --  88* 84*  CREATININE 11.04* 7.59* 8.31*  --   --     Estimated Creatinine Clearance: 13.6 mL/min (A) (by C-G formula based on SCr of 8.31 mg/dL (H)).   Medical History: Past Medical History:  Diagnosis Date  . Anemia of chronic disease   . Chest pain    a. 11/2006 Cath: minimal nonobs dzs; b. 08/2018 MV: EF 64%, no ischemia.  . Chronic hypotension   . Chronic right hip pain   . Depression   . Diastolic dysfunction    a. 06/2016 Echo: EF 55-60%, Gr1 DD; b. 03/2017 Echo: EF 55-60%, LVH. Mild TR; c. 11/2018 Echo: EF 60-65%, mild conc LVH. Mildly dil RA. Triv AI.  [name] ESRD (end stage renal disease) ([place])    a. Long h/o graft access difficulties.  . Hyperkalemia   . Hyperlipidemia   . Hypothyroidism   . Sleep apnea   . Super obesity   . Type II diabetes mellitus ([facility])     Medications:  Scheduled:  . buPROPion  300 mg Oral Daily  . Chlorhexidine Gluconate Cloth  6 each Topical Q0600  . cinacalcet  30 mg Oral Q breakfast  . docusate sodium  100 mg Oral Daily  . epoetin (EPOGEN/PROCRIT) injection  10,000 Units Intravenous Q M,W,F-HD  . feeding supplement (NEPRO CARB STEADY)  237 mL Oral BID BM  . ferric citrate  420 mg Oral TID  . lactobacillus  1 g Oral TID WC  . levothyroxine  75 mcg Oral QAC breakfast   . midodrine  5 mg Oral TID WC  . multivitamin  1 tablet Oral QHS  . pantoprazole (PROTONIX) IV  40 mg Intravenous QHS  . sevelamer carbonate  1,600 mg Oral TID WC  . sodium chloride flush  3 mL Intravenous Q12H   Infusions:  . sodium chloride Stopped (11/29/2018 0834)  . sodium chloride    . sodium chloride    . sodium chloride    . sodium chloride    . sodium chloride    . ampicillin (OMNIPEN) IV Stopped (12/18/18 0529)  . anticoagulant sodium citrate    . argatroban 0.5 mcg/kg/min (12/18/18 1805)  . cefTRIAXone (ROCEPHIN)  IV 2 g (12/18/18 1132)  . norepinephrine (LEVOPHED) Adult infusion 8 mcg/min (12/18/18 1921)  . sodium chloride      Assessment: Pharmacy consulted for argatroban drip for 48 yo ICU patient requiring a norepinephrine infusion and received dialysis. Patient received dialysis on 3/20. Patient is being evaluated for possible HIT.   Goal of Therapy:  aPTT 50-90 seconds Monitor platelets by anticoagulation protocol: Yes   Plan:  Patient initiated on argatroban 0.5mg /kg/min.  First two aPTTs are in therapeutic range. Will obtain aPTT with am labs. Will plan to obtain aPTT Q12hr while patient is in therapeutic range and in ICU.   Will continue to evaluate heparin induced platelet antibody.   Pharmacy will continue to monitor and adjust per consult.   , L 12/18/2018,7:50 PM

## 2018-12-18 NOTE — Progress Notes (Signed)
This RN, student attempted to bathe the client with NA, [place]. Pt has refused all care and stated repeatedly that she "wants to be left alone". RN, Katharine Look and MD,Aleskerov were made aware of the events.

## 2018-12-18 NOTE — Consult Note (Signed)
Cardiology Consult    Patient ID: Genevive Pember MRN: [id number], DOB/AGE: 49-Jun-1972   Admit date: 12/19/2018 Date of Consult: 12/18/2018  Primary Physician: Marco Collie, MD Primary Cardiologist: Previously seen by Dr. Shearon Stalls @ [place] Requesting Provider: Ashok Norris, MD  Patient Profile    Carlisha Latu is a 48 y.o. female with a history of ESRD on HD, chronic hypotension morbid obesity, OSA, DMII, anemia, HL, hyperkalemia, hypothyroidism, and depression, who is being seen today for the evaluation of bacteremia w/ TEE request at the request of Dr. Jerelyn Charles.  Past Medical History   Past Medical History:  Diagnosis Date   Anemia of chronic disease    Chest pain    a. 11/2006 Cath: minimal nonobs dzs; b. 08/2018 MV: EF 64%, no ischemia.   Chronic hypotension    Chronic right hip pain    Depression    Diastolic dysfunction    a. 06/2016 Echo: EF 55-60%, Gr1 DD; b. 03/2017 Echo: EF 55-60%, LVH. Mild TR; c. 11/2018 Echo: EF 60-65%, mild conc LVH. Mildly dil RA. Triv AI.   ESRD (end stage renal disease) ([place])    a. Long h/o graft access difficulties.   Hyperkalemia    Hyperlipidemia    Hypothyroidism    Sleep apnea    Super obesity    Type II diabetes mellitus ([place])     Past Surgical History:  Procedure Laterality Date   CESAREAN SECTION     x 2   DIALYSIS/PERMA CATHETER INSERTION N/A 12/02/2018   Procedure: DIALYSIS/PERMA CATHETER INSERTION;  Surgeon: Algernon Huxley, MD;  Location: [facility];  Service: Cardiovascular;  Laterality: N/A;   TEMPORARY DIALYSIS CATHETER N/A 12/10/2018   Procedure: TEMPORARY DIALYSIS CATHETER PLACEMENT WITH SEDATION;  Surgeon: Algernon Huxley, MD;  Location: [facility];  Service: Cardiovascular;  Laterality: N/A;     Allergies  Allergies  Allergen Reactions   Morphine And Related    Naproxen     History of Present Illness    48 year old female with the above complex past medical history including end-stage renal  disease on hemodialysis, morbid obesity, sleep apnea, type 2 diabetes mellitus, anemia, hyperlipidemia, hypokalemia, hypothyroidism, chronic hypotension and depression.  She was previous evaluated for coronary artery disease in March 2008 with catheterization at that time showing minimal, nonobstructive CAD.  Echocardiogram was performed over the years have always shown normal LV function with mild concentric LVH.  Her last ischemic evaluation occurred in December 2019 through the [facility] system, and was nonischemic.  This was performed not because she was having cardiac symptoms however, because she was being evaluated for bariatric surgery.  Ms. Barkalow has had multiple hospitalizations recently.  She was previously admitted to [facility] in the setting of right hip pain and sciatica earlier this year.  She was subsequently discharged and then presented to [facility] in late January in the setting of ongoing right hip pain and need for hemodialysis, which she had missed for 3 days due to hip pain.  She was subsequently transferred to [facility] on February 1, where she remained until February 22.  During that admission, She was treated for chronic right hip pain was seen by orthopedic surgery with recommendation for conservative management and physical therapy.  She was subsequently discharged home.  Unfortunately, on March 4 in the setting of SVC syndrome, she required vascular access placement in the left internal jugular vein.  She also required balloon angioplasty of the left jugular  and innominate veins, as well as the superior vena cava.  Unfortunately, she developed recurrent clot in her vascular access, which was noted when she presented for dialysis on March 17.  She was referred to the emergency department where her creatinine was 10 but potassium was within normal limits.  She was subsequently admitted and follow admission was noted to have an abnormal CBC with differential with smear  showing toxic granulation and left shift.  Blood cultures were obtained and she was placed on vancomycin and Rocephin.  Blood cultures returned with enterococcus and she was switched to ampicillin.  She was seen by infectious disease with recommendation for transesophageal echocardiogram.  Chest wall echo showed normal LV function without any significant valvular disease.  We have been asked to consider TEE.   On interview, patient reports back pain.  She is tender to any amount of touch and has not fully able to participate with examination.  Inpatient Medications     buPROPion  300 mg Oral Daily   Chlorhexidine Gluconate Cloth  6 each Topical Q0600   cinacalcet  30 mg Oral Q breakfast   docusate sodium  100 mg Oral Daily   epoetin (EPOGEN/PROCRIT) injection  10,000 Units Intravenous Q M,W,F-HD   feeding supplement (NEPRO CARB STEADY)  237 mL Oral BID BM   ferric citrate  420 mg Oral TID   lactobacillus  1 g Oral TID WC   levothyroxine  75 mcg Oral QAC breakfast   midodrine  5 mg Oral TID WC   multivitamin  1 tablet Oral QHS   pantoprazole (PROTONIX) IV  40 mg Intravenous QHS   sevelamer carbonate  1,600 mg Oral TID WC   sodium chloride flush  3 mL Intravenous Q12H    Family History    No known history of premature coronary artery disease.  Social History    Social History   Socioeconomic History   Marital status: Married    Spouse name: Not on file   Number of children: Not on file   Years of education: Not on file   Highest education level: Not on file  Occupational History   Not on file  Social Needs   Financial resource strain: Not on file   Food insecurity:    Worry: Not on file    Inability: Not on file   Transportation needs:    Medical: Not on file    Non-medical: Not on file  Tobacco Use   Smoking status: Never Smoker   Smokeless tobacco: Never Used  Substance and Sexual Activity   Alcohol use: Never    Frequency: Never   Drug  use: Never   Sexual activity: Not on file  Lifestyle   Physical activity:    Days per week: Not on file    Minutes per session: Not on file   Stress: Not on file  Relationships   Social connections:    Talks on phone: Not on file    Gets together: Not on file    Attends religious service: Not on file    Active member of club or organization: Not on file    Attends meetings of clubs or organizations: Not on file    Relationship status: Not on file   Intimate partner violence:    Fear of current or ex partner: Not on file    Emotionally abused: Not on file    Physically abused: Not on file    Forced sexual activity: Not on file  Other  Topics Concern   Not on file  Social History Narrative   Not on file     Review of Systems    General:  No chills, fever, night sweats or weight changes.  Cardiovascular:  No chest pain, dyspnea on exertion, +++ edema, no orthopnea, palpitations, paroxysmal nocturnal dyspnea. Dermatological: No rash, lesions/masses Respiratory: No cough, dyspnea Urologic: No hematuria, dysuria Abdominal:   No nausea, vomiting, diarrhea, bright red blood per rectum, melena, or hematemesis.  She has chronic back pain and right hip pain. Neurologic:  No visual changes, wkns, changes in mental status. All other systems reviewed and are otherwise negative except as noted above.  Physical Exam    Blood pressure (!) 85/69, pulse 93, temperature 97.6 F (36.4 C), temperature source Oral, resp. rate (!) 9, height 5\' 4"  (1.626 m), weight (!) 180.3 kg, SpO2 100 %.  General: Pleasant, NAD Psych: Flat affect. Neuro:  Moves all extremities spontaneously.  Difficult to determine orientation as she does not participate much in interview. HEENT: Normal  Neck: Obese, supple, difficult to gauge JVP secondary body habitus.   Lungs:  Resp regular and unlabored, diminished breath sounds bilaterally with poor effort. Heart: RRR no s3, s4, or murmurs.  Distant heart  sounds. Abdomen: Obese, soft, diffusely tender BS + x 4.  Extremities: No clubbing, cyanosis.  Trace to 1+ bilateral lower extremity edema. DP/PT/Radials 1+ and equal bilaterally.  Labs     Lab Results  Component Value Date   WBC 6.9 12/18/2018   HGB 8.2 (L) 12/18/2018   HCT 27.7 (L) 12/18/2018   MCV 90.2 12/18/2018   PLT 42 (L) 12/18/2018    Recent Labs  Lab 12/17/2018 0439  12/18/18 0331  NA 140   < > 140  K 4.7   < > 4.0  CL 98   < > 99  CO2 22   < > 25  BUN 154*   < > 97*  CREATININE 11.04*   < > 8.31*  CALCIUM 8.6*   < > 8.9  PROT 6.2*  --   --   BILITOT 0.9  --   --   ALKPHOS 77  --   --   ALT 10  --   --   AST 15  --   --   GLUCOSE 59*   < > 102*   < > = values in this interval not displayed.     Radiology Studies    Ct Head Wo Contrast  Result Date: 12/15/2018 CLINICAL DATA:  Ataxia, stroke suspected EXAM: CT HEAD WITHOUT CONTRAST TECHNIQUE: Contiguous axial images were obtained from the base of the skull through the vertex without intravenous contrast. COMPARISON:  None. FINDINGS: Brain: Mild cerebral atrophy. No acute intracranial abnormality. Specifically, no hemorrhage, hydrocephalus, mass lesion, acute infarction, or significant intracranial injury. Vascular: No hyperdense vessel or unexpected calcification. Skull: No acute calvarial abnormality. Sinuses/Orbits: Visualized paranasal sinuses and mastoids clear. Orbital soft tissues unremarkable. Other: None IMPRESSION: Mild age advanced cerebral volume loss. No acute intracranial abnormality. Electronically Signed   By: Rolm Baptise M.D.   On: 12/15/2018 19:22    ECG & Cardiac Imaging    Sinus tachycardia, 114, baseline artifact, left axis deviation, IVCD- personally reviewed.  Assessment & Plan    1.  Acute enterococcal bacteremia/sepsis: Infected left IJ hemodialysis catheter with abnormal blood cultures.  Echo shows normal LV function without any significant valvular disease.  She remains on ampicillin and  Rocephin therapy.  We have been asked  to consider transesophageal echocardiogram.  Given ongoing hypotension, multiple comorbidities, and morbid obesity, she would be at high risk for complications in the setting of transesophageal echocardiogram.  She has previously required general anesthesia for endoscopy in the past.  We will not schedule TEE at this time and recommend continue antibiotic therapy per infectious disease.  2.  Chronic hypotension: Remains on low-dose norepinephrine for pressure support.  Notes indicate she has refused her home doses of midodrine on multiple occasions.  3.  End-stage renal disease: Hemodialysis per nephrology.  Signed, Murray Hodgkins, NP 12/18/2018, 1:09 PM  For questions or updates, please contact   Please consult [email or web address] for contact info under Cardiology/STEMI.

## 2018-12-18 NOTE — Progress Notes (Signed)
   12/18/18 0400  Clinical Encounter Type  Visited With Patient  Visit Type Initial;Spiritual support  Spiritual Encounters  Spiritual Needs Prayer

## 2018-12-18 NOTE — Progress Notes (Signed)
Pre hemodialysis report received from Philippa Chester, RN. Received patient in ICU bed 11 via. Awake and verbally responsive with altered mentation per primary nurse. No distress noted.  Right femoral CVC without signs and symptoms of infection.  Dressing in place and uncompromised. Accessed CVC per policy and found patent on each side. Hemodialysis treatment initiated at 1724.  Plan for 3.5 hours treatment without fluid removal.     12/18/18 1724  Hand-Off documentation  Report received from (Full Name) S. Borba, RN  Vital Signs  Temp 97.7 F (36.5 C)  Temp Source Oral  Resp 19  BP (!) 88/61  BP Location Right Arm  BP Method Automatic  Patient Position (if appropriate) Lying  Oxygen Therapy  SpO2 95 %  Pain Assessment  Faces Pain Scale 6  Pain Location Generalized  Machine Checks  Machine Number 5  Station Number  (icu 11)  UF/Alarm Test Passed  Conductivity: Meter 14  Conductivity: Machine  13.8  pH 7.2  Reverse Osmosis 3  Normal Saline Lot Number [id number]  Dialyzer Lot Number [id number]  Disposable Set Lot Number [id number]  Machine Temperature 98.6 F (37 C)  Musician and Audible Yes  Blood Lines Intact and Secured Yes  Pre Treatment Patient Checks  Vascular access used during treatment Catheter  Hepatitis B Surface Antigen Results Negative  Date Hepatitis B Surface Antigen Drawn 12/08/18  Hepatitis B Surface Antibody  (less than 10)  Date Hepatitis B Surface Antibody Drawn 11/26/18  Hemodialysis Consent Verified Yes  Hemodialysis Standing Orders Initiated Yes  ECG (Telemetry) Monitor On Yes  Prime Ordered Normal Saline  Length of  DialysisTreatment -hour(s) 3.5 Hour(s)  Dialysis Treatment Comments levophed running for BP support  Dialyzer Elisio 17H NR  Dialysate 2K, 2.5 Ca  Dialysis Anticoagulant None  Dialysate Flow Ordered 600  Blood Flow Rate Ordered 400 mL/min  Ultrafiltration Goal 0 Liters  During Hemodialysis Assessment  Blood Flow Rate (mL/min) 400  mL/min  Arterial Pressure (mmHg) -160 mmHg  Venous Pressure (mmHg) 180 mmHg  Transmembrane Pressure (mmHg) 50 mmHg  Ultrafiltration Rate (mL/min) 140 mL/min  Dialysate Flow Rate (mL/min) 600 ml/min  Conductivity: Machine  13.8 (myron 14.0)  HD Safety Checks Performed Yes  Intra-Hemodialysis Comments Tx initiated

## 2018-12-18 NOTE — Progress Notes (Signed)
Patient agreed to turn and bath. Altamese Dilling, daughter updated.

## 2018-12-18 NOTE — Consult Note (Signed)
Pharmacy Antibiotic Note  Lindsey Russell is a 48 y.o. female admitted on 11/30/2018 with Vascular Access Problems. BCID now showing Enterococcus species. Pharmacy has been consulted for ampicillin  Dosing. Patient has PMH of ESRD on HD.   Plan: Continue Ampicillin 2g IV every 12 hours.   Patient also ordered Ceftriaxone 2g IV Q12hr for synergy in setting of possible endocarditis. Per cardiology, patient not a candidate for TTE at this time.   Height: 5\' 4"  (162.6 cm) Weight: (!) 397 lb 7.8 oz (180.3 kg) IBW/kg (Calculated) : 54.7  Temp (24hrs), Avg:97.8 F (36.6 C), Min:97.4 F (36.3 C), Max:98.1 F (36.7 C)  Recent Labs  Lab 12/12/18 1219 12/20/2018 2017 12/15/18 0923 12/15/2018 0439 12/17/18 1702 12/18/18 0331  WBC 5.5 9.7 5.8 5.5 10.3 6.9  CREATININE 8.81* 10.86*  --  11.04* 7.59* 8.31*    Estimated Creatinine Clearance: 13.6 mL/min (A) (by C-G formula based on SCr of 8.31 mg/dL (H)).    Allergies  Allergen Reactions  . Heparin     R/O HIT; initiated 12/17/28  . Morphine And Related   . Naproxen     Antimicrobials this admission: Vancomycin 3/17 x 1 Ceftriaxone 3/17; 3/19 >>  Ampicillin 3/18 >>   Microbiology results: 3/17 BCx: Enterococcus faecalis  3/18 MRSA PCR: MRSA Negative, Staph Aureus Negative  3/19 CDiff: negative   Thank you for allowing pharmacy to be a part of this patient's care.  Lindsey Russell, 12/18/2018 8:03 PM

## 2018-12-19 ENCOUNTER — Encounter: Payer: Self-pay | Admitting: Pulmonary Disease

## 2018-12-19 LAB — PHOSPHORUS
Phosphorus: 3.2 mg/dL (ref 2.5–4.6)
Phosphorus: 3.8 mg/dL (ref 2.5–4.6)

## 2018-12-19 LAB — MAGNESIUM
Magnesium: 1.8 mg/dL (ref 1.7–2.4)
Magnesium: 1.8 mg/dL (ref 1.7–2.4)

## 2018-12-19 LAB — BASIC METABOLIC PANEL
Anion gap: 16 — ABNORMAL HIGH (ref 5–15)
BUN: 41 mg/dL — AB (ref 6–20)
CO2: 24 mmol/L (ref 22–32)
Calcium: 8.6 mg/dL — ABNORMAL LOW (ref 8.9–10.3)
Chloride: 97 mmol/L — ABNORMAL LOW (ref 98–111)
Creatinine, Ser: 4.69 mg/dL — ABNORMAL HIGH (ref 0.44–1.00)
GFR calc Af Amer: 12 mL/min — ABNORMAL LOW (ref >60)
GFR calc non Af Amer: 10 mL/min — ABNORMAL LOW (ref >60)
Glucose, Bld: 117 mg/dL — ABNORMAL HIGH (ref 70–99)
Potassium: 3 mmol/L — ABNORMAL LOW (ref 3.5–5.1)
SODIUM: 137 mmol/L (ref 135–145)

## 2018-12-19 LAB — CBC WITH DIFFERENTIAL/PLATELET
Abs Immature Granulocytes: 0.34 10*3/uL — ABNORMAL HIGH (ref 0.00–0.07)
BASOS PCT: 0 %
Basophils Absolute: 0 10*3/uL (ref 0.0–0.1)
Eosinophils Absolute: 0.1 10*3/uL (ref 0.0–0.5)
Eosinophils Relative: 1 %
HCT: 27 % — ABNORMAL LOW (ref 36.0–46.0)
HEMOGLOBIN: 7.9 g/dL — AB (ref 12.0–15.0)
Immature Granulocytes: 5 %
Lymphocytes Relative: 19 %
Lymphs Abs: 1.3 10*3/uL (ref 0.7–4.0)
MCH: 26.7 pg (ref 26.0–34.0)
MCHC: 29.3 g/dL — ABNORMAL LOW (ref 30.0–36.0)
MCV: 91.2 fL (ref 80.0–100.0)
MONO ABS: 0.4 10*3/uL (ref 0.1–1.0)
Monocytes Relative: 5 %
NEUTROS ABS: 4.7 10*3/uL (ref 1.7–7.7)
Neutrophils Relative %: 70 %
Platelets: 40 10*3/uL — ABNORMAL LOW (ref 150–400)
RBC: 2.96 MIL/uL — ABNORMAL LOW (ref 3.87–5.11)
RDW: 17 % — ABNORMAL HIGH (ref 11.5–15.5)
Smear Review: DECREASED
WBC: 6.8 10*3/uL (ref 4.0–10.5)
nRBC: 1.5 % — ABNORMAL HIGH (ref 0.0–0.2)

## 2018-12-19 LAB — APTT
APTT: 97 s — AB (ref 24–36)
aPTT: 88 seconds — ABNORMAL HIGH (ref 24–36)

## 2018-12-19 LAB — HEPARIN INDUCED PLATELET AB (HIT ANTIBODY): Heparin Induced Plt Ab: 0.105 OD (ref 0.000–0.400)

## 2018-12-19 MED ORDER — NOREPINEPHRINE BITARTRATE 1 MG/ML IV SOLN
0.0000 ug/min | INTRAVENOUS | Status: DC
  Administered 2018-12-19: 10 ug/min via INTRAVENOUS
  Administered 2018-12-19: 9 ug/min via INTRAVENOUS
  Administered 2018-12-19: 15 ug/min via INTRAVENOUS
  Administered 2018-12-20: 8 ug/min via INTRAVENOUS
  Administered 2018-12-20: 14 ug/min via INTRAVENOUS
  Administered 2018-12-21 – 2018-12-22 (×2): 4 ug/min via INTRAVENOUS
  Administered 2018-12-22: 8 ug/min via INTRAVENOUS
  Filled 2018-12-19 (×9): qty 4

## 2018-12-19 NOTE — Progress Notes (Signed)
CRITICAL CARE NOTE         SUBJECTIVE FINDINGS & SIGNIFICANT EVENTS    Prognosis is guarded -Could not accommodate patient size for CT scan -Husband remains POA, family in disagreement regarding care plan -  Currently full code. -levophed for pressure support with a map of 60 as goal   PAST MEDICAL HISTORY   Past Medical History:  Diagnosis Date  . Anemia of chronic disease   . Chest pain    a. 11/2006 Cath: minimal nonobs dzs; b. 08/2018 MV: EF 64%, no ischemia.  . Chronic hypotension   . Chronic right hip pain   . Depression   . Diastolic dysfunction    a. 06/2016 Echo: EF 55-60%, Gr1 DD; b. 03/2017 Echo: EF 55-60%, LVH. Mild TR; c. 11/2018 Echo: EF 60-65%, mild conc LVH. Mildly dil RA. Triv AI.  [name] ESRD (end stage renal disease) ([place])    a. Long h/o graft access difficulties.  . Hyperkalemia   . Hyperlipidemia   . Hypothyroidism   . Sleep apnea   . Super obesity   . Type II diabetes mellitus ([place])      SURGICAL HISTORY   Past Surgical History:  Procedure Laterality Date  . CESAREAN SECTION     x 2  . DIALYSIS/PERMA CATHETER INSERTION N/A 12/02/2018   Procedure: DIALYSIS/PERMA CATHETER INSERTION;  Surgeon: Algernon Huxley, MD;  Location: [facility];  Service: Cardiovascular;  Laterality: N/A;  . TEMPORARY DIALYSIS CATHETER N/A 12/11/2018   Procedure: TEMPORARY DIALYSIS CATHETER PLACEMENT WITH SEDATION;  Surgeon: Algernon Huxley, MD;  Location: [facility];  Service: Cardiovascular;  Laterality: N/A;     FAMILY HISTORY   History reviewed. No pertinent family history.   SOCIAL HISTORY   Social History   Tobacco Use  . Smoking status: Never Smoker  . Smokeless tobacco: Never Used  Substance Use Topics  . Alcohol use: Never    Frequency: Never  . Drug use: Never      MEDICATIONS   Current Medication:  Current Facility-Administered Medications:  .  0.9 %  sodium chloride infusion, , Intravenous, PRN, Algernon Huxley, MD, Stopped at 12/15/2018 [phone] .  0.9 %  sodium chloride infusion, 100 mL, Intravenous, PRN, Lateef, Munsoor, MD .  0.9 %  sodium chloride infusion, 100 mL, Intravenous, PRN, Lateef, Munsoor, MD .  0.9 %  sodium chloride infusion, 250 mL, Intravenous, Continuous, Salary, Montell D, MD .  0.9 %  sodium chloride infusion, 250 mL, Intravenous, PRN, Ottie Glazier, MD .  acetaminophen (TYLENOL) tablet 650 mg, 650 mg, Oral, Q6H PRN, 650 mg at 12/17/18 0721 **OR** acetaminophen (TYLENOL) suppository 650 mg, 650 mg, Rectal, Q6H PRN, Lucky Cowboy, Erskine Squibb, MD .  alteplase (CATHFLO ACTIVASE) injection 2 mg, 2 mg, Intracatheter, Once PRN, Lateef, Munsoor, MD .  ampicillin (OMNIPEN) 2 g in sodium chloride 0.9 % 100 mL IVPB, 2 g, Intravenous, Q12H, Dew, Erskine Squibb, MD, Stopped at 12/19/18 [phone] .  anticoagulant sodium citrate solution 5 mL, 5 mL, Intravenous, BID BM & HS PRN, Charlett Nose, RPH .  argatroban 1 mg/mL infusion, 0.5 mcg/kg/min, Intravenous, Continuous, Charlett Nose, RPH, Last Rate: 5.41 mL/hr at 12/19/18 0827, 0.5 mcg/kg/min at 12/19/18 0827 .  buPROPion (WELLBUTRIN XL) 24 hr tablet 300 mg, 300 mg, Oral, Daily, Dew, Erskine Squibb, MD, 300 mg at 12/15/18 0100 .  cefTRIAXone (ROCEPHIN) 2 g in sodium chloride 0.9 % 100 mL IVPB, 2 g, Intravenous, Q12H, Tsosie Billing, MD,  Last Rate: 200 mL/hr at 12/19/18 0944, 2 g at 12/19/18 0944 .  Chlorhexidine Gluconate Cloth 2 % PADS 6 each, 6 each, Topical, Q0600, Algernon Huxley, MD, 6 each at 12/19/18 [phone] .  cinacalcet (SENSIPAR) tablet 30 mg, 30 mg, Oral, Q breakfast, Dew, Erskine Squibb, MD .  docusate sodium (COLACE) capsule 100 mg, 100 mg, Oral, Daily, Dew, Erskine Squibb, MD .  epoetin alfa (EPOGEN,PROCRIT) injection 10,000 Units, 10,000 Units, Intravenous, Q M,W,F-HD, Algernon Huxley, MD, 10,000 Units at 12/18/18 2003 .   feeding supplement (NEPRO CARB STEADY) liquid 237 mL, 237 mL, Oral, BID BM, Salary, Montell D, MD .  ferric citrate (AURYXIA) tablet 420 mg, 420 mg, Oral, TID, Dew, Erskine Squibb, MD .  HYDROcodone-acetaminophen (NORCO/VICODIN) 5-325 MG per tablet 1 tablet, 1 tablet, Oral, Q6H PRN, Algernon Huxley, MD, 1 tablet at 12/14/2018 1728 .  HYDROmorphone (DILAUDID) injection 0.5 mg, 0.5 mg, Intravenous, Q8H PRN, Algernon Huxley, MD, 0.5 mg at 12/19/18 1241 .  HYDROmorphone (DILAUDID) injection 1 mg, 1 mg, Intravenous, Once PRN, Lucky Cowboy, Erskine Squibb, MD .  iohexol (OMNIPAQUE) 300 MG/ML solution 125 mL, 125 mL, Intravenous, Once PRN, Salary, Montell D, MD .  lactobacillus (FLORANEX/LACTINEX) granules 1 g, 1 g, Oral, TID WC, Dew, Erskine Squibb, MD .  levothyroxine (SYNTHROID, LEVOTHROID) tablet 75 mcg, 75 mcg, Oral, QAC breakfast, Algernon Huxley, MD, 75 mcg at 12/15/18 1135 .  lidocaine (PF) (XYLOCAINE) 1 % injection 5 mL, 5 mL, Intradermal, PRN, Lateef, Munsoor, MD .  lidocaine-prilocaine (EMLA) cream 1 application, 1 application, Topical, PRN, Lateef, Munsoor, MD .  meclizine (ANTIVERT) tablet 25 mg, 25 mg, Oral, BID PRN, Algernon Huxley, MD .  midodrine (PROAMATINE) tablet 5 mg, 5 mg, Oral, TID WC, Dew, Erskine Squibb, MD, 5 mg at 12/17/18 1240 .  multivitamin (RENA-VIT) tablet 1 tablet, 1 tablet, Oral, QHS, Salary, Montell D, MD .  norepinephrine (LEVOPHED) 4 mg in dextrose 5 % 250 mL (0.016 mg/mL) infusion, 2-10 mcg/min, Intravenous, Titrated, Salary, Montell D, MD, Last Rate: 30 mL/hr at 12/19/18 0827, 8 mcg/min at 12/19/18 0827 .  ondansetron (ZOFRAN) injection 4 mg, 4 mg, Intravenous, Q6H PRN, Lucky Cowboy, Erskine Squibb, MD .  ondansetron (ZOFRAN) injection 4 mg, 4 mg, Intravenous, Q6H PRN, Lucky Cowboy, Erskine Squibb, MD .  ondansetron (ZOFRAN) tablet 4 mg, 4 mg, Oral, Q6H PRN **OR** [DISCONTINUED] ondansetron (ZOFRAN) injection 4 mg, 4 mg, Intravenous, Q6H PRN, Algernon Huxley, MD, 4 mg at 12/15/18 1256 .  pantoprazole (PROTONIX) injection 40 mg, 40 mg, Intravenous, QHS,  Camryn Lampson, MD, 40 mg at 12/18/18 2211 .  pentafluoroprop-tetrafluoroeth (GEBAUERS) aerosol 1 application, 1 application, Topical, PRN, Lateef, Munsoor, MD .  polyethylene glycol (MIRALAX / GLYCOLAX) packet 17 g, 17 g, Oral, Daily PRN, Lucky Cowboy, Erskine Squibb, MD .  sevelamer carbonate (RENVELA) tablet 1,600 mg, 1,600 mg, Oral, TID WC, Algernon Huxley, MD, 1,600 mg at 12/15/18 1624 .  sodium chloride 0.9 % bolus 1,000 mL, 1,000 mL, Intravenous, Once, Salary, Montell D, MD .  sodium chloride flush (NS) 0.9 % injection 3 mL, 3 mL, Intravenous, Q12H, Lanney Gins, Jobeth Pangilinan, MD, 3 mL at 12/18/18 2218 .  sodium chloride flush (NS) 0.9 % injection 3 mL, 3 mL, Intravenous, PRN, Lanney Gins, Paquita Printy, MD    ALLERGIES   Heparin; Morphine and related; and Naproxen    REVIEW OF SYSTEMS     10 system ROS conducted and is negative except as per subjective findings  PHYSICAL EXAMINATION   Vitals:  12/19/18 1200 12/19/18 1230  BP: (!) 83/56   Pulse: 94   Resp: (!) 28   Temp:  37.2 C  SpO2: 95%     GENERAL: No apparent distress HEAD: Normocephalic, atraumatic.  EYES: Pupils equal, round, reactive to light.  No scleral icterus.  MOUTH: Moist mucosal membrane. NECK: Supple. No thyromegaly. No nodules. No JVD.  PULMONARY: Decreased breath sounds bilaterally CARDIOVASCULAR: S1 and S2. Regular rate and rhythm. No murmurs, rubs, or gallops.  GASTROINTESTINAL: Soft, nontender, non-distended. No masses. Positive bowel sounds. No hepatosplenomegaly.  MUSCULOSKELETAL: No swelling, clubbing, or edema.  NEUROLOGIC: Mild distress due to acute illness SKIN:intact,warm,dry   LABS AND IMAGING     LAB RESULTS: Recent Labs  Lab 12/18/18 0331 12/18/18 2113 12/19/18 0751  NA 140 137 137  K 4.0 2.9* 3.0*  CL 99 97* 97*  CO2 25 27 24   BUN 97* 30* 41*  CREATININE 8.31* 3.06* 4.69*  GLUCOSE 102* 110* 117*   Recent Labs  Lab 12/18/18 0331 12/18/18 2113 12/19/18 0751  HGB 8.2* 8.2* 7.9*  HCT 27.7* 27.6*  27.0*  WBC 6.9 6.1 6.8  PLT 42* 38* 40*     IMAGING RESULTS: No results found.    ASSESSMENT AND PLAN    -M  CARDIAC FAILURE- -hypotension- possibly due to sepsis vs hypothyroidsim -Vasopressor support with Levophed as needed with a goal map of 60 or more - conservative fluids and levothyroxine. -oxygen as needed -follow up cardiac enzymes as indicated ICU monitoring   Renal Failure-ESRD on HD -appreciate nephrology input- -follow chem 7 -follow UO    NEUROLOGY -encephalopathy - uremia vs septic  -on HD and IV antibiotcs   Sepsis with enterococcus bacteremia - on ampicillin -random cortisol-no adrenal insufficiency at this time - fibrinogen for poss DIC-reference range    Thrombocytopenia - possibly due to sepsis vs HITs -high risk for heparin antibody induced due to recurrent heparin exposure -we will use Argatroban in the interim -peripheral blood film for chistocytes -Serotonin release assay pending   ID -continue IV abx as prescibed -follow up cultures   GI/Nutrition GI PROPHYLAXIS as indicated DIET-->TF's as tolerated Constipation protocol as indicated  ENDO - ICU hypoglycemic\Hyperglycemia protocol -check FSBS per protocol   ELECTROLYTES -follow labs as needed -replace as needed -pharmacy consultation   DVT/GI PRX ordered -SCDs  TRANSFUSIONS AS NEEDED MONITOR FSBS ASSESS the need for LABS as needed     Critical care time (minutes):35 Critical care time was exclusive of: Separately billable procedures and treating other patients Critical care was necessary to treat or prevent imminent or life-threatening deterioration of the following conditions:Encephalopathy, sepsis with bacteremia, hypothyroidism, sacral wound, super morbid obesity, diabetes mellitus, hypertension, thrombocytopenia, ESRD, anemia Critical care was time spent personally by me on the following activities:  Development of treatment plan with patient or surrogate, discussions with consultants, evaluation of patient's response to treatment, examination of patient, obtaining history from patient or surrogate, ordering and performing treatments and interventions, ordering and review of laboratory studies and re-evaluation of patient's condition. I assumed direction of critical care for this patient from another provider in my specialty: no   This document was prepared using Dragon voice recognition software and may include unintentional dictation errors.    Ottie Glazier, M.D.  Division of [place]

## 2018-12-19 NOTE — Progress Notes (Signed)
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 Heparin     R/O HIT; initiated 12/17/28  . Morphine And Related   . Naproxen     Patient Measurements: Height: 5\' 4"  (162.6 cm) Weight: (!) 397 lb 7.8 oz (180.3 kg) IBW/kg (Calculated) : 54.7  Vital Signs: Temp: 97.5 F (36.4 C) (03/21 0200) Temp Source: Oral (03/21 0200) BP: 69/54 (03/21 0500) Pulse Rate: 96 (03/21 0500)  Labs: Recent Labs    12/17/18 1702 12/18/18 0331 12/18/18 1518 12/18/18 1839 12/18/18 2113 12/19/18 0751  HGB 8.7* 8.2*  --   --  8.2*  --   HCT 29.0* 27.7*  --   --  27.6*  --   PLT 46* 42*  --   --  38*  --   APTT  --   --  88* 84*  --  88*  CREATININE 7.59* 8.31*  --   --  3.06*  --     Estimated Creatinine Clearance: 36.8 mL/min (A) (by C-G formula based on SCr of 3.06 mg/dL (H)).   Medical History: Past Medical History:  Diagnosis Date  . Anemia of chronic disease   . Chest pain    a. 11/2006 Cath: minimal nonobs dzs; b. 08/2018 MV: EF 64%, no ischemia.  . Chronic hypotension   . Chronic right hip pain   . Depression   . Diastolic dysfunction    a. 06/2016 Echo: EF 55-60%, Gr1 DD; b. 03/2017 Echo: EF 55-60%, LVH. Mild TR; c. 11/2018 Echo: EF 60-65%, mild conc LVH. Mildly dil RA. Triv AI.  [name] ESRD (end stage renal disease) ([place])    a. Long h/o graft access difficulties.  . Hyperkalemia   . Hyperlipidemia   . Hypothyroidism   . Sleep apnea   . Super obesity   . Type II diabetes mellitus ([facility])     Medications:  Scheduled:  . buPROPion  300 mg Oral Daily  . Chlorhexidine Gluconate Cloth  6 each Topical Q0600  . cinacalcet  30 mg Oral Q breakfast  . docusate sodium  100 mg Oral Daily  . epoetin (EPOGEN/PROCRIT) injection  10,000 Units Intravenous Q M,W,F-HD  . feeding supplement (NEPRO CARB STEADY)  237 mL Oral BID BM  . ferric citrate  420 mg Oral TID  . lactobacillus  1 g Oral TID WC  .  levothyroxine  75 mcg Oral QAC breakfast  . midodrine  5 mg Oral TID WC  . multivitamin  1 tablet Oral QHS  . pantoprazole (PROTONIX) IV  40 mg Intravenous QHS  . sevelamer carbonate  1,600 mg Oral TID WC  . sodium chloride flush  3 mL Intravenous Q12H   Infusions:  . sodium chloride Stopped (12/28/2018 0834)  . sodium chloride    . sodium chloride    . sodium chloride    . sodium chloride    . sodium chloride    . ampicillin (OMNIPEN) IV 2 g (12/18/18 2020)  . anticoagulant sodium citrate    . argatroban 0.5 mcg/kg/min (12/19/18 0521)  . cefTRIAXone (ROCEPHIN)  IV Stopped (12/18/18 2248)  . norepinephrine (LEVOPHED) Adult infusion 8 mcg/min (12/19/18 0521)  . sodium chloride      Assessment: Pharmacy consulted for argatroban drip for 48 yo ICU patient requiring a norepinephrine infusion and received dialysis. Patient received dialysis on 3/20. Patient is being evaluated for possible HIT.   Goal of Therapy:  aPTT 50-90 seconds  Monitor platelets by anticoagulation protocol: Yes   Plan:  Patient initiated on argatroban 0.5mg /kg/min. First two aPTTs are in therapeutic range. Will obtain aPTT with am labs. Will plan to obtain aPTT Q12hr while patient is in therapeutic range and in ICU.   3/21 aPTT @0751 = 88. Will continue current drip rate. Next aptt in 12 hrs.   Will continue to evaluate heparin induced platelet antibody. HIT lab ordered 3/20   Pharmacy will continue to monitor and adjust per consult.   Lindsey Russell A 12/19/2018,8:19 AM

## 2018-12-19 NOTE — Progress Notes (Signed)
[facility]  ROUNDING NOTE   Subjective:  Patient remains critically ill at the moment. Still on low-dose pressors. Completed dialysis yesterday.  Objective:  Vital signs in last 24 hours:  Temp:  [97.4 F (36.3 C)-98.1 F (36.7 C)] 98.1 F (36.7 C) (03/21 0800) Pulse Rate:  [87-103] 94 (03/21 0800) Resp:  [9-25] 20 (03/21 0800) BP: (52-112)/(32-85) 88/60 (03/21 0800) SpO2:  [85 %-100 %] 95 % (03/21 0800)  Weight change:  Filed Weights   12/17/18 1100 12/17/18 1315 12/17/18 1420  Weight: (!) 180.3 kg (!) 180.3 kg (!) 180.3 kg    Intake/Output: I/O last 3 completed shifts: In: 1069 [I.V.:769.2; IV Piggyback:299.8] Out: 350 [Stool:350]   Intake/Output this shift:  Total I/O In: 108.6 [I.V.:108.6] Out: -   Physical Exam: General: No acute distress  Head: Normocephalic, atraumatic. Moist oral mucosal membranes  Eyes: Anicteric  Neck: Supple, trachea midline  Lungs:  Clear to auscultation, normal effort  Heart: S1S2 no rubs  Abdomen:  Soft, nontender, bowel sounds present  Extremities: 1+ peripheral edema.  Neurologic: Lethargic but arousable  Skin: No lesions  Access: L IJ PC removed, R femoral temporary dialysis catheter in place    Basic Metabolic Panel: Recent Labs  Lab 12/13/2018 0439 12/17/18 1326 12/17/18 1702 12/18/18 0331 12/18/18 2113 12/19/18 0751 12/19/18 0752  NA 140  --  139 140 137 137  --   K 4.7  --  4.0 4.0 2.9* 3.0*  --   CL 98  --  97* 99 97* 97*  --   CO2 22  --  24 25 27 24   --   GLUCOSE 59*  --  117* 102* 110* 117*  --   BUN 154*  --  92* 97* 30* 41*  --   CREATININE 11.04*  --  7.59* 8.31* 3.06* 4.69*  --   CALCIUM 8.6*  --  8.5* 8.9 8.3* 8.6*  --   MG  --   --   --   --  1.8  --  1.8  PHOS  --  7.0* 5.0*  --  2.1*  --  3.2    Liver Function Tests: Recent Labs  Lab 12/12/18 1219 12/07/2018 0439 12/18/18 2113  AST 13* 15 22  ALT 7 10 14   ALKPHOS 81 77 88  BILITOT 0.8 0.9 0.7  PROT 6.3* 6.2* 6.2*  ALBUMIN  2.2* 2.1* 2.0*   Recent Labs  Lab 12/12/18 1219  LIPASE 21   No results for input(s): AMMONIA in the last 168 hours.  CBC: Recent Labs  Lab 12/13/2018 2017 12/15/18 0923 12/09/2018 0439 12/17/18 1702 12/18/18 0331 12/18/18 2113 12/19/18 0751  WBC 9.7 5.8 5.5 10.3 6.9 6.1 6.8  NEUTROABS 8.0* 4.3  --   --  4.7  --  4.7  HGB 8.5* 7.8* 8.0* 8.7* 8.2* 8.2* 7.9*  HCT 28.3* 25.6* 26.9* 29.0* 27.7* 27.6* 27.0*  MCV 89.3 89.5 90.0 90.1 90.2 90.2 91.2  PLT 44* 37* 43* 46* 42* 38* 40*    Cardiac Enzymes: No results for input(s): CKTOTAL, CKMB, CKMBINDEX, TROPONINI in the last 168 hours.  BNP: Invalid input(s): POCBNP  CBG: Recent Labs  Lab 12/22/2018 1536 12/29/2018 1539 12/17/2018 1558 12/17/18 1411  GLUCAP 40* 45* 148* 86    Microbiology: Results for orders placed or performed during the hospital encounter of 12/12/2018  CULTURE, BLOOD (ROUTINE X 2) w Reflex to ID Panel     Status: Abnormal   Collection Time: 12/15/18  3:09 PM  Result Value Ref Range Status   Specimen Description   Final    BLOOD LEFT HAND Performed at [facility], [address] [place], [place] [address]    Special Requests   Final    AEROBIC BOTTLE ONLY Blood Culture results may not be optimal due to an inadequate volume of blood received in culture bottles Performed at [facility], [address] [place], [place] [address]    Culture  Setup Time   Final    GRAM POSITIVE COCCI AEROBIC BOTTLE ONLY CRITICAL VALUE NOTED.  VALUE IS CONSISTENT WITH PREVIOUSLY REPORTED AND CALLED VALUE. Performed at [facility], [address] [place], [place] [address]    Culture (A)  Final    ENTEROCOCCUS FAECALIS SUSCEPTIBILITIES PERFORMED ON PREVIOUS CULTURE WITHIN THE LAST 5 DAYS. Performed at [facility], [place] [address] [place], [place] [address]    Report Status 12/18/2018 FINAL  Final  CULTURE, BLOOD (ROUTINE X 2) w Reflex to ID Panel     Status: Abnormal   Collection  Time: 12/15/18  3:16 PM  Result Value Ref Range Status   Specimen Description   Final    BLOOD RIGHT HAND Performed at [facility], [address] [place], [place] [address]    Special Requests   Final    BOTTLES DRAWN AEROBIC AND ANAEROBIC Blood Culture results may not be optimal due to an inadequate volume of blood received in culture bottles Performed at [facility], [address] [place], [place] [address]    Culture  Setup Time   Final    GRAM POSITIVE COCCI IN BOTH AEROBIC AND ANAEROBIC BOTTLES CRITICAL RESULT CALLED TO, READ BACK BY AND VERIFIED WITH: Vita Barley HALLHAI 12/15/2018 0448 REC Performed at [facility], [place] [address] [place], [place] [address]    Culture ENTEROCOCCUS FAECALIS (A)  Final   Report Status 12/18/2018 FINAL  Final   Organism ID, Bacteria ENTEROCOCCUS FAECALIS  Final      Susceptibility   Enterococcus faecalis - MIC*    AMPICILLIN <=2 SENSITIVE Sensitive     VANCOMYCIN 1 SENSITIVE Sensitive     GENTAMICIN SYNERGY SENSITIVE Sensitive     * ENTEROCOCCUS FAECALIS  Blood Culture ID Panel (Reflexed)     Status: Abnormal   Collection Time: 12/15/18  3:16 PM  Result Value Ref Range Status   Enterococcus species DETECTED (A) NOT DETECTED Final    Comment: CRITICAL RESULT CALLED TO, READ BACK BY AND VERIFIED WITH: SHEENA HALLHAI 12/21/2018 0448 REC    Vancomycin resistance NOT DETECTED NOT DETECTED Final   Listeria monocytogenes NOT DETECTED NOT DETECTED Final   Staphylococcus species NOT DETECTED NOT DETECTED Final   Staphylococcus aureus (BCID) NOT DETECTED NOT DETECTED Final   Streptococcus species NOT DETECTED NOT DETECTED Final   Streptococcus agalactiae NOT DETECTED NOT DETECTED Final   Streptococcus pneumoniae NOT DETECTED NOT DETECTED Final   Streptococcus pyogenes NOT DETECTED NOT DETECTED Final   Acinetobacter baumannii NOT DETECTED NOT DETECTED Final   Enterobacteriaceae species NOT DETECTED NOT DETECTED Final    Enterobacter cloacae complex NOT DETECTED NOT DETECTED Final   Escherichia coli NOT DETECTED NOT DETECTED Final   Klebsiella oxytoca NOT DETECTED NOT DETECTED Final   Klebsiella pneumoniae NOT DETECTED NOT DETECTED Final   Proteus species NOT DETECTED NOT DETECTED Final   Serratia marcescens NOT DETECTED NOT DETECTED Final   Haemophilus influenzae NOT DETECTED NOT DETECTED Final   Neisseria meningitidis NOT DETECTED  NOT DETECTED Final   Pseudomonas aeruginosa NOT DETECTED NOT DETECTED Final   Candida albicans NOT DETECTED NOT DETECTED Final   Candida glabrata NOT DETECTED NOT DETECTED Final   Candida krusei NOT DETECTED NOT DETECTED Final   Candida parapsilosis NOT DETECTED NOT DETECTED Final   Candida tropicalis NOT DETECTED NOT DETECTED Final    Comment: Performed at [facility], [place], [place] [address]  Surgical PCR screen     Status: None   Collection Time: 11/30/2018  4:49 AM  Result Value Ref Range Status   MRSA, PCR NEGATIVE NEGATIVE Final   Staphylococcus aureus NEGATIVE NEGATIVE Final    Comment: (NOTE) The Xpert SA Assay (FDA approved for NASAL specimens in patients 28 years of age and older), is one component of a comprehensive surveillance program. It is not intended to diagnose infection nor to guide or monitor treatment. Performed at [facility], [address] [place], [place] [address]   Cath Tip Culture     Status: Abnormal (Preliminary result)   Collection Time: 12/22/2018  4:38 PM  Result Value Ref Range Status   Specimen Description   Final    CATH TIP Performed at [facility], [address], [place], [place] [address]    Special Requests   Final    NONE Performed at [facility], [address], [place], [place] [address]    Culture (A)  Final    ENTEROCOCCUS FAECALIS SUSCEPTIBILITIES TO FOLLOW CULTURE REINCUBATED FOR BETTER GROWTH Performed at [facility], [place] [address],  [place], [place] [address]    Report Status PENDING  Incomplete  C difficile quick scan w PCR reflex     Status: None   Collection Time: 12/17/18  6:25 AM  Result Value Ref Range Status   C Diff antigen NEGATIVE NEGATIVE Final   C Diff toxin NEGATIVE NEGATIVE Final   C Diff interpretation No C. difficile detected.  Final    Comment: Performed at [facility], [address] [place], [place] [address]  CULTURE, BLOOD (ROUTINE X 2) w Reflex to ID Panel     Status: None (Preliminary result)   Collection Time: 12/17/18  5:02 PM  Result Value Ref Range Status   Specimen Description BLOOD PORTA CATH  Final   Special Requests   Final    BOTTLES DRAWN AEROBIC AND ANAEROBIC Blood Culture adequate volume   Culture   Final    NO GROWTH 2 DAYS Performed at [facility], [address] [place], [place] [address]    Report Status PENDING  Incomplete  CULTURE, BLOOD (ROUTINE X 2) w Reflex to ID Panel     Status: None (Preliminary result)   Collection Time: 12/18/18  3:30 AM  Result Value Ref Range Status   Specimen Description BLOOD RIGHT ANTECUBITAL  Final   Special Requests   Final    BOTTLES DRAWN AEROBIC AND ANAEROBIC Blood Culture results may not be optimal due to an excessive volume of blood received in culture bottles   Culture   Final    NO GROWTH 1 DAY Performed at [facility], [address], [place], [place] [address]    Report Status PENDING  Incomplete    Coagulation Studies: No results for input(s): LABPROT, INR in the last 72 hours.  Urinalysis: No results for input(s): COLORURINE, LABSPEC, PHURINE, GLUCOSEU, HGBUR, BILIRUBINUR, KETONESUR, PROTEINUR, UROBILINOGEN, NITRITE, LEUKOCYTESUR in the last 72 hours.  Invalid input(s): APPERANCEUR  Imaging: No results found.   Medications:   . sodium chloride Stopped (12/02/2018 0834)  . sodium chloride    . sodium chloride    . sodium chloride    . sodium chloride    . ampicillin (OMNIPEN) IV  Stopped (12/19/18 0836)  . anticoagulant sodium citrate    . argatroban 0.5 mcg/kg/min (12/19/18 0827)  . cefTRIAXone (ROCEPHIN)  IV 2 g (12/19/18 0944)  . norepinephrine (LEVOPHED) Adult infusion 8 mcg/min (12/19/18 0827)  . sodium chloride     . buPROPion  300 mg Oral Daily  . Chlorhexidine Gluconate Cloth  6 each Topical Q0600  . cinacalcet  30 mg Oral Q breakfast  . docusate sodium  100 mg Oral Daily  . epoetin (EPOGEN/PROCRIT) injection  10,000 Units Intravenous Q M,W,F-HD  . feeding supplement (NEPRO CARB STEADY)  237 mL Oral BID BM  . ferric citrate  420 mg Oral TID  . lactobacillus  1 g Oral TID WC  . levothyroxine  75 mcg Oral QAC breakfast  . midodrine  5 mg Oral TID WC  . multivitamin  1 tablet Oral QHS  . pantoprazole (PROTONIX) IV  40 mg Intravenous QHS  . sevelamer carbonate  1,600 mg Oral TID WC  . sodium chloride flush  3 mL Intravenous Q12H   sodium chloride, sodium chloride, sodium chloride, sodium chloride, acetaminophen **OR** acetaminophen, alteplase, anticoagulant sodium citrate, HYDROcodone-acetaminophen, HYDROmorphone (DILAUDID) injection, HYDROmorphone (DILAUDID) injection, iohexol, lidocaine (PF), lidocaine-prilocaine, meclizine, ondansetron (ZOFRAN) IV, ondansetron (ZOFRAN) IV, ondansetron **OR** [DISCONTINUED] ondansetron (ZOFRAN) IV, pentafluoroprop-tetrafluoroeth, polyethylene glycol, sodium chloride flush  Assessment/ Plan:  48 y.o. female with past medical history of ESRD on HD, morbid obesity, diabetes mellitus type 2, hypertension, anemia of chronic kidney disease admitted with non functional permcath and sepsis.  [facility]/MWF2/EDW 176.5  1.  ESRD on HD MWF.  Patient completed dialysis yesterday.  No acute indication for dialysis today.  Reevaluate for dialysis on Monday.  2.  Anemia of chronic kidney disease/thrombocytopenia.  Hemoglobin is drifted down to 7.9.  Maintain the patient on Epogen however continue to monitor CBC closely..  3.   Secondary hyperparathyroidism.  Phosphorus now down to 3.2.  We will continue to monitor.  4.  Sepsis/hypotension.  Enterococcus noted to be growing in the blood.  Continue norepinephrine to maintain a map of 65 or greater.  5.  Overall prognosis quite guarded.   LOS: 5 Nathania Waldman 3/21/202010:25 AM

## 2018-12-19 NOTE — Progress Notes (Signed)
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 Heparin     R/O HIT; initiated 12/17/28  . Morphine And Related   . Naproxen     Patient Measurements: Height: 5\' 4"  (162.6 cm) Weight: (!) 397 lb 7.8 oz (180.3 kg) IBW/kg (Calculated) : 54.7  Vital Signs: Temp: 98.8 F (37.1 C) (03/21 2000) Temp Source: Axillary (03/21 2000) BP: 79/53 (03/21 2000) Pulse Rate: 93 (03/21 2000)  Labs: Recent Labs    12/18/18 0331  12/18/18 1839 12/18/18 2113 12/19/18 0751 12/19/18 1933  HGB 8.2*  --   --  8.2* 7.9*  --   HCT 27.7*  --   --  27.6* 27.0*  --   PLT 42*  --   --  38* 40*  --   APTT  --    < > 84*  --  88* 97*  CREATININE 8.31*  --   --  3.06* 4.69*  --    < > = values in this interval not displayed.    Estimated Creatinine Clearance: 24 mL/min (A) (by C-G formula based on SCr of 4.69 mg/dL (H)).   Medical History: Past Medical History:  Diagnosis Date  . Anemia of chronic disease   . Chest pain    a. 11/2006 Cath: minimal nonobs dzs; b. 08/2018 MV: EF 64%, no ischemia.  . Chronic hypotension   . Chronic right hip pain   . Depression   . Diastolic dysfunction    a. 06/2016 Echo: EF 55-60%, Gr1 DD; b. 03/2017 Echo: EF 55-60%, LVH. Mild TR; c. 11/2018 Echo: EF 60-65%, mild conc LVH. Mildly dil RA. Triv AI.  [name] ESRD (end stage renal disease) ([place])    a. Long h/o graft access difficulties.  . Hyperkalemia   . Hyperlipidemia   . Hypothyroidism   . Sleep apnea   . Super obesity   . Type II diabetes mellitus (HCC)     Medications:  Scheduled:  . buPROPion  300 mg Oral Daily  . Chlorhexidine Gluconate Cloth  6 each Topical Q0600  . cinacalcet  30 mg Oral Q breakfast  . docusate sodium  100 mg Oral Daily  . epoetin (EPOGEN/PROCRIT) injection  10,000 Units Intravenous Q M,W,F-HD  . feeding supplement (NEPRO CARB STEADY)  237 mL Oral BID BM  . ferric citrate  420 mg Oral TID  .  lactobacillus  1 g Oral TID WC  . levothyroxine  75 mcg Oral QAC breakfast  . midodrine  5 mg Oral TID WC  . multivitamin  1 tablet Oral QHS  . pantoprazole (PROTONIX) IV  40 mg Intravenous QHS  . sevelamer carbonate  1,600 mg Oral TID WC  . sodium chloride flush  3 mL Intravenous Q12H   Infusions:  . sodium chloride Stopped (12/18/2018 0834)  . sodium chloride    . sodium chloride    . sodium chloride    . sodium chloride    . ampicillin (OMNIPEN) IV Stopped (12/19/18 1735)  . anticoagulant sodium citrate    . argatroban 0.5 mcg/kg/min (12/19/18 1846)  . cefTRIAXone (ROCEPHIN)  IV Stopped (12/19/18 1014)  . norepinephrine (LEVOPHED) Adult infusion 9 mcg/min (12/19/18 1927)  . sodium chloride      Assessment: Pharmacy consulted for argatroban drip for 48 yo ICU patient requiring a norepinephrine infusion and received dialysis. Patient received dialysis on 3/20. Patient is being evaluated for possible HIT.   Goal of Therapy:  aPTT 50-90 seconds Monitor platelets by anticoagulation protocol: Yes   Plan:  Patient initiated on argatroban 0.5mg /kg/min. First two aPTTs are in therapeutic range. Will obtain aPTT with am labs. Will plan to obtain aPTT Q12hr while patient is in therapeutic range and in ICU.   3/21 aPTT @0751 = 88. Will continue current drip rate. Next aptt in 12 hrs.   Will continue to evaluate heparin induced platelet antibody. HIT lab ordered 3/20   3/21: aPTT @ 1929 = 97.  Will continue pt on current rate and recheck aPTT on 3/22 @ 0500.   Pharmacy will continue to monitor and adjust per consult.   Lindsey Russell D 12/19/2018,9:39 PM
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
## 2018-12-20 DIAGNOSIS — L8990 Pressure ulcer of unspecified site, unspecified stage: Secondary | ICD-10-CM

## 2018-12-20 LAB — CBC WITH DIFFERENTIAL/PLATELET
Abs Immature Granulocytes: 0.41 10*3/uL — ABNORMAL HIGH (ref 0.00–0.07)
BASOS ABS: 0 10*3/uL (ref 0.0–0.1)
Basophils Relative: 0 %
Eosinophils Absolute: 0.1 10*3/uL (ref 0.0–0.5)
Eosinophils Relative: 1 %
HCT: 28.1 % — ABNORMAL LOW (ref 36.0–46.0)
Hemoglobin: 8.2 g/dL — ABNORMAL LOW (ref 12.0–15.0)
Immature Granulocytes: 4 %
Lymphocytes Relative: 15 %
Lymphs Abs: 1.5 10*3/uL (ref 0.7–4.0)
MCH: 26.9 pg (ref 26.0–34.0)
MCHC: 29.2 g/dL — ABNORMAL LOW (ref 30.0–36.0)
MCV: 92.1 fL (ref 80.0–100.0)
Monocytes Absolute: 0.4 10*3/uL (ref 0.1–1.0)
Monocytes Relative: 4 %
NEUTROS PCT: 76 %
NRBC: 1.1 % — AB (ref 0.0–0.2)
Neutro Abs: 7.4 10*3/uL (ref 1.7–7.7)
Platelets: 47 10*3/uL — ABNORMAL LOW (ref 150–400)
RBC: 3.05 MIL/uL — AB (ref 3.87–5.11)
RDW: 17.2 % — AB (ref 11.5–15.5)
Smear Review: UNDETERMINED
WBC: 9.8 10*3/uL (ref 4.0–10.5)

## 2018-12-20 LAB — BASIC METABOLIC PANEL
Anion gap: 12 (ref 5–15)
BUN: 48 mg/dL — AB (ref 6–20)
CALCIUM: 8.8 mg/dL — AB (ref 8.9–10.3)
CO2: 25 mmol/L (ref 22–32)
CREATININE: 5.71 mg/dL — AB (ref 0.44–1.00)
Chloride: 99 mmol/L (ref 98–111)
GFR calc Af Amer: 9 mL/min — ABNORMAL LOW (ref >60)
GFR calc non Af Amer: 8 mL/min — ABNORMAL LOW (ref >60)
Glucose, Bld: 171 mg/dL — ABNORMAL HIGH (ref 70–99)
Potassium: 2.9 mmol/L — ABNORMAL LOW (ref 3.5–5.1)
Sodium: 136 mmol/L (ref 135–145)

## 2018-12-20 LAB — CATH TIP CULTURE

## 2018-12-20 LAB — APTT
aPTT: 102 seconds — ABNORMAL HIGH (ref 24–36)
aPTT: 77 seconds — ABNORMAL HIGH (ref 24–36)
aPTT: 93 seconds — ABNORMAL HIGH (ref 24–36)
aPTT: 82 seconds — ABNORMAL HIGH (ref 24–36)

## 2018-12-20 MED ORDER — POTASSIUM CHLORIDE 10 MEQ/100ML IV SOLN
10.0000 meq | INTRAVENOUS | Status: AC
  Administered 2018-12-20 (×6): 10 meq via INTRAVENOUS
  Filled 2018-12-20 (×6): qty 100

## 2018-12-20 MED ORDER — POTASSIUM CHLORIDE CRYS ER 20 MEQ PO TBCR: 40.0000 meq | EXTENDED_RELEASE_TABLET | Freq: Once | ORAL | Status: DC

## 2018-12-20 NOTE — Progress Notes (Signed)
Patient is more alert today, able to swallow synthroid and midodrine crushed in applesauce. Decreased levophed. She does have a flexiseal that was placed on 12/02/2018. Multiple skin tears as noted per 12/15/18 wound care notes. Cleaned and dressed with foam.

## 2018-12-20 NOTE — Progress Notes (Signed)
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   Medical History: Past Medical History:  Diagnosis Date  . Anemia of chronic disease   . Chest pain    a. 11/2006 Cath: minimal nonobs dzs; b. 08/2018 MV: EF 64%, no ischemia.  . Chronic hypotension   . Chronic right hip pain   . Depression   . Diastolic dysfunction    a. 06/2016 Echo: EF 55-60%, Gr1 DD; b. 03/2017 Echo: EF 55-60%, LVH. Mild TR; c. 11/2018 Echo: EF 60-65%, mild conc LVH. Mildly dil RA. Triv AI.  [name] ESRD (end stage renal disease) ([place])    a. Long h/o graft access difficulties.  . Hyperkalemia   . Hyperlipidemia   . Hypothyroidism   . Sleep apnea   . Super obesity   . Type II diabetes mellitus (HCC)     Medications:  Scheduled:  . buPROPion  300 mg Oral Daily  . Chlorhexidine Gluconate Cloth  6 each Topical Q0600  . cinacalcet  30 mg Oral Q breakfast  . docusate sodium  100 mg Oral Daily  . epoetin (EPOGEN/PROCRIT) injection  10,000 Units Intravenous Q M,W,F-HD  . feeding supplement (NEPRO CARB STEADY)  237 mL Oral BID BM  . ferric citrate  420 mg Oral TID  . lactobacillus  1 g Oral TID WC  . levothyroxine  75 mcg Oral QAC breakfast  . midodrine  5 mg Oral TID WC  . multivitamin  1 tablet Oral QHS  . pantoprazole (PROTONIX) IV  40 mg Intravenous QHS  . sevelamer carbonate  1,600 mg Oral  TID WC  . sodium chloride flush  3 mL Intravenous Q12H   Infusions:  . sodium chloride Stopped (12/07/2018 0834)  . sodium chloride    . sodium chloride    . sodium chloride    . sodium chloride    . ampicillin (OMNIPEN) IV Stopped (12/20/18 0540)  . anticoagulant sodium citrate    . argatroban 0.35 mcg/kg/min (12/20/18 0558)  . cefTRIAXone (ROCEPHIN)  IV Stopped (12/19/18 2318)  . norepinephrine (LEVOPHED) Adult infusion 14 mcg/min (12/20/18 0558)  . potassium chloride    . sodium chloride      Assessment: Pharmacy consulted for argatroban drip for 48 yo ICU patient requiring a norepinephrine infusion and received dialysis. Patient received dialysis on 3/20. Patient is being evaluated for possible HIT.  Patient initiated on argatroban 0.26mcg/kg/min. First two aPTTs are in therapeutic range. Will obtain aPTT with am labs. Will plan to obtain aPTT Q12hr while patient is in therapeutic range and in ICU. Original infusion rate 0.5 mcg/kg/min  Goal of Therapy:  aPTT 50-90 seconds Monitor platelets by anticoagulation protocol: Yes   Course of Therapy 03/20 1518 aPTT 88s 03/20 1839 aPTT 84s 03/21 0751 aPTT 88s 03/21 1933  aPTT 97s 03/22 0521 aPTT 102s: decreased to 0.35 mcg/kg/min 03/22 0929 aPTT 77s 03/22 1314 aPTT 93s   Plan:  --decrease infusion to 0.30 mcg/kg/min  --recheck aPTT level in 2 hours following rate change  Pharmacy will continue to monitor and adjust per consult.   Dallie Piles, PharmD Clinical Pharmacist 12/20/2018 7:31 AM

## 2018-12-20 NOTE — Progress Notes (Signed)
CRITICAL CARE NOTE       SUBJECTIVE FINDINGS & SIGNIFICANT EVENTS   Patient remains critically ill. Prognosis is guarded   PAST MEDICAL HISTORY   Past Medical History:  Diagnosis Date  . Anemia of chronic disease   . Chest pain    a. 11/2006 Cath: minimal nonobs dzs; b. 08/2018 MV: EF 64%, no ischemia.  . Chronic hypotension   . Chronic right hip pain   . Depression   . Diastolic dysfunction    a. 06/2016 Echo: EF 55-60%, Gr1 DD; b. 03/2017 Echo: EF 55-60%, LVH. Mild TR; c. 11/2018 Echo: EF 60-65%, mild conc LVH. Mildly dil RA. Triv AI.  [name] ESRD (end stage renal disease) ([place])    a. Long h/o graft access difficulties.  . Hyperkalemia   . Hyperlipidemia   . Hypothyroidism   . Sleep apnea   . Super obesity   . Type II diabetes mellitus ([place])      SURGICAL HISTORY   Past Surgical History:  Procedure Laterality Date  . CESAREAN SECTION     x 2  . DIALYSIS/PERMA CATHETER INSERTION N/A 12/02/2018   Procedure: DIALYSIS/PERMA CATHETER INSERTION;  Surgeon: Algernon Huxley, MD;  Location: [facility];  Service: Cardiovascular;  Laterality: N/A;  . TEMPORARY DIALYSIS CATHETER N/A 11/30/2018   Procedure: TEMPORARY DIALYSIS CATHETER PLACEMENT WITH SEDATION;  Surgeon: Algernon Huxley, MD;  Location: [facility];  Service: Cardiovascular;  Laterality: N/A;     FAMILY HISTORY   History reviewed. No pertinent family history.   SOCIAL HISTORY   Social History   Tobacco Use  . Smoking status: Never Smoker  . Smokeless tobacco: Never Used  Substance Use Topics  . Alcohol use: Never    Frequency: Never  . Drug use: Never     MEDICATIONS   Current Medication:  Current Facility-Administered Medications:  .  0.9 %  sodium chloride infusion, , Intravenous, PRN, Algernon Huxley, MD, Stopped at  12/11/2018 [phone] .  0.9 %  sodium chloride infusion, 100 mL, Intravenous, PRN, Lateef, Munsoor, MD .  acetaminophen (TYLENOL) tablet 650 mg, 650 mg, Oral, Q6H PRN, 650 mg at 12/17/18 0721 **OR** acetaminophen (TYLENOL) suppository 650 mg, 650 mg, Rectal, Q6H PRN, Lucky Cowboy, Erskine Squibb, MD .  alteplase (CATHFLO ACTIVASE) injection 2 mg, 2 mg, Intracatheter, Once PRN, Lateef, Munsoor, MD .  ampicillin (OMNIPEN) 2 g in sodium chloride 0.9 % 100 mL IVPB, 2 g, Intravenous, Q12H, Dew, Erskine Squibb, MD,  Stopped at 12/20/18 0540 .  anticoagulant sodium citrate solution 5 mL, 5 mL, Intravenous, BID BM & HS PRN, Charlett Nose, RPH .  argatroban 1 mg/mL infusion, 0.3 mcg/kg/min, Intravenous, Continuous, Dallie Piles, RPH, Last Rate: 3.25 mL/hr at 12/20/18 1454, 0.3 mcg/kg/min at 12/20/18 1454 .  buPROPion (WELLBUTRIN XL) 24 hr tablet 300 mg, 300 mg, Oral, Daily, Dew, Erskine Squibb, MD, 300 mg at 12/15/18 0100 .  cefTRIAXone (ROCEPHIN) 2 g in sodium chloride 0.9 % 100 mL IVPB, 2 g, Intravenous, Q12H, Ravishankar, Jayashree, MD, Last Rate: 200 mL/hr at 12/20/18 1125, 2 g at 12/20/18 1125 .  Chlorhexidine Gluconate Cloth 2 % PADS 6 each, 6 each, Topical, Q0600, Algernon Huxley, MD, 6 each at 12/20/18 0520 .  cinacalcet (SENSIPAR) tablet 30 mg, 30 mg, Oral, Q breakfast, Dew, Erskine Squibb, MD .  epoetin alfa (EPOGEN,PROCRIT) injection 10,000 Units, 10,000 Units, Intravenous, Q M,W,F-HD, Algernon Huxley, MD, 10,000 Units at 12/18/18 2003 .  ferric citrate (AURYXIA) tablet 420 mg, 420 mg, Oral, TID, Dew, Erskine Squibb, MD .  HYDROcodone-acetaminophen (NORCO/VICODIN) 5-325 MG per tablet 1 tablet, 1 tablet, Oral, Q6H PRN, Algernon Huxley, MD, 1 tablet at 12/03/2018 1728 .  HYDROmorphone (DILAUDID) injection 0.5 mg, 0.5 mg, Intravenous, Q8H PRN, Algernon Huxley, MD, 0.5 mg at 12/19/18 1241 .  HYDROmorphone (DILAUDID) injection 1 mg, 1 mg, Intravenous, Once PRN, Lucky Cowboy, Erskine Squibb, MD .  iohexol (OMNIPAQUE) 300 MG/ML solution 125 mL, 125 mL, Intravenous, Once PRN,  Salary, Montell D, MD .  lactobacillus (FLORANEX/LACTINEX) granules 1 g, 1 g, Oral, TID WC, Dew, Erskine Squibb, MD .  levothyroxine (SYNTHROID, LEVOTHROID) tablet 75 mcg, 75 mcg, Oral, QAC breakfast, Algernon Huxley, MD, 75 mcg at 12/20/18 0805 .  lidocaine (PF) (XYLOCAINE) 1 % injection 5 mL, 5 mL, Intradermal, PRN, Lateef, Munsoor, MD .  lidocaine-prilocaine (EMLA) cream 1 application, 1 application, Topical, PRN, Lateef, Munsoor, MD .  meclizine (ANTIVERT) tablet 25 mg, 25 mg, Oral, BID PRN, Algernon Huxley, MD .  midodrine (PROAMATINE) tablet 5 mg, 5 mg, Oral, TID WC, Dew, Erskine Squibb, MD, 5 mg at 12/20/18 1155 .  multivitamin (RENA-VIT) tablet 1 tablet, 1 tablet, Oral, QHS, Salary, Montell D, MD .  norepinephrine (LEVOPHED) 4 mg in dextrose 5 % 250 mL (0.016 mg/mL) infusion, 0-40 mcg/min, Intravenous, Titrated, Sherald Balbuena, MD, Last Rate: 15 mL/hr at 12/20/18 1500, 4 mcg/min at 12/20/18 1500 .  ondansetron (ZOFRAN) injection 4 mg, 4 mg, Intravenous, Q6H PRN, Lucky Cowboy, Erskine Squibb, MD .  ondansetron (ZOFRAN) injection 4 mg, 4 mg, Intravenous, Q6H PRN, Lucky Cowboy, Erskine Squibb, MD .  ondansetron (ZOFRAN) tablet 4 mg, 4 mg, Oral, Q6H PRN **OR** [DISCONTINUED] ondansetron (ZOFRAN) injection 4 mg, 4 mg, Intravenous, Q6H PRN, Algernon Huxley, MD, 4 mg at 12/15/18 1256 .  pantoprazole (PROTONIX) injection 40 mg, 40 mg, Intravenous, QHS, Glema Takaki, MD, 40 mg at 12/19/18 2246 .  pentafluoroprop-tetrafluoroeth (GEBAUERS) aerosol 1 application, 1 application, Topical, PRN, Lateef, Munsoor, MD .  polyethylene glycol (MIRALAX / GLYCOLAX) packet 17 g, 17 g, Oral, Daily PRN, Lucky Cowboy, Erskine Squibb, MD .  sevelamer carbonate (RENVELA) tablet 1,600 mg, 1,600 mg, Oral, TID WC, Dew, Erskine Squibb, MD, 1,600 mg at 12/15/18 1624    ALLERGIES   Heparin; Morphine and related; and Naproxen    REVIEW OF SYSTEMS    Patient is unwilling to participate in ROS discourse.  PHYSICAL EXAMINATION   Vitals:   12/20/18 1230 12/20/18 1300  BP:  102/83 (!)  84/53  Pulse:    Resp: 14 16  Temp:  97.8 F (36.6 C)  SpO2:      GENERAL: No apparent distress HEAD: Normocephalic, atraumatic.  EYES: Pupils equal, round, reactive to light.  No scleral icterus.  MOUTH: Moist mucosal membrane. NECK: Supple. No thyromegaly. No nodules. No JVD.  PULMONARY: Decreased breath sounds bilaterally CARDIOVASCULAR: S1 and S2. Regular rate and rhythm. No murmurs, rubs, or gallops.  GASTROINTESTINAL: Soft morbidly obese, nontender, non-distended. No masses. Positive bowel sounds. No hepatosplenomegaly.  MUSCULOSKELETAL: No swelling, clubbing, or edema.  NEUROLOGIC: Mild distress due to acute illness SKIN:intact,warm,dry   LABS AND IMAGING       LAB RESULTS: Recent Labs  Lab 12/18/18 2113 12/19/18 0751 12/20/18 0521  NA 137 137 136  K 2.9* 3.0* 2.9*  CL 97* 97* 99  CO2 27 24 25   BUN 30* 41* 48*  CREATININE 3.06* 4.69* 5.71*  GLUCOSE 110* 117* 171*   Recent Labs  Lab 12/18/18 2113 12/19/18 0751 12/20/18 0521  HGB 8.2* 7.9* 8.2*  HCT 27.6* 27.0* 28.1*  WBC 6.1 6.8 9.8  PLT 38* 40* 47*     IMAGING RESULTS: No results found.    ASSESSMENT AND PLAN    -Multidisciplinary rounds held today  CARDIAC FAILURE- -hypotension- possibly due to sepsis vs hypothyroidsim -Vasopressor support with Levophed as needed with a goal map of 60 or more - conservative fluids and levothyroxine. -oxygen as needed -follow up cardiac enzymes as indicated ICU monitoring   Renal Failure-ESRD on HD -appreciate nephrology input-for HD tommorow -follow chem 7 -follow UO    NEUROLOGY -encephalopathy - uremia vs septic  -on HD and IV antibiotcs   Sepsis with enterococcus bacteremia - on ampicillin -random cortisol-no adrenal insufficiency at this time - fibrinogen for poss DIC-reference range    Thrombocytopenia - possibly due to sepsis vs HITs-ab neg , serotonin release pending -high risk for heparin antibody induced  due to recurrent heparin exposure -we will useArgatrobanin the interim    ID -continue IV abx as prescibed -follow up cultures   GI/Nutrition GI PROPHYLAXIS as indicated DIET-->TF's as tolerated Constipation protocol as indicated  ENDO - ICU hypoglycemic\Hyperglycemia protocol -check FSBS per protocol   ELECTROLYTES -follow labs as needed -replace as needed -pharmacy consultation   DVT/GI PRX ordered -SCDs  TRANSFUSIONS AS NEEDED MONITOR FSBS ASSESS the need for LABS as needed  Critical care time (minutes):35 Critical care time was exclusive of: Separately billable procedures and treating other patients Critical care was necessary to treat or prevent imminent or life-threatening deterioration of the following conditions:Encephalopathy, sepsis with bacteremia, hypothyroidism, sacral wound, super morbid obesity, diabetes mellitus, hypertension, thrombocytopenia, ESRD, anemia Critical care was time spent personally by me on the following activities: Development of treatment plan with patient or surrogate, discussions with consultants, evaluation of patient's response to treatment, examination of patient, obtaining history from patient or surrogate, ordering and performing treatments and interventions, ordering and review of laboratory studies and re-evaluation of patient's condition. I assumed direction of critical care for this patient from another provider in my specialty: no   This document was prepared using Dragon voice recognition software and may include unintentional dictation errors.  Ottie Glazier, M.D.  Division of [place]
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
## 2018-12-20 NOTE — Progress Notes (Signed)
[facility]  ROUNDING NOTE   Subjective:  Patient remains on pressors. Lethargic but arousable. Due for dialysis again tomorrow.  Objective:  Vital signs in last 24 hours:  Temp:  [97.4 F (36.3 C)-98.8 F (37.1 C)] 97.4 F (36.3 C) (03/22 0821) Pulse Rate:  [71-109] 87 (03/22 1200) Resp:  [10-24] 16 (03/22 1300) BP: (66-111)/(37-86) 84/53 (03/22 1300) SpO2:  [91 %-100 %] 100 % (03/22 1200)  Weight change:  Filed Weights   12/17/18 1100 12/17/18 1315 12/17/18 1420  Weight: (!) 180.3 kg (!) 180.3 kg (!) 180.3 kg    Intake/Output: I/O last 3 completed shifts: In: 2008.5 [I.V.:1608.5; IV Piggyback:400] Out: 100 [Stool:100]   Intake/Output this shift:  No intake/output data recorded.  Physical Exam: General: No acute distress  Head: Normocephalic, atraumatic. Moist oral mucosal membranes  Eyes: Anicteric  Neck: Supple, trachea midline  Lungs:  Clear to auscultation, normal effort  Heart: S1S2 no rubs  Abdomen:  Soft, nontender, bowel sounds present  Extremities: 1+ peripheral edema.  Neurologic: Lethargic but arousable  Skin: No lesions  Access: L IJ PC removed, R femoral temporary dialysis catheter in place    Basic Metabolic Panel: Recent Labs  Lab 12/17/18 1326 12/17/18 1702 12/18/18 0331 12/18/18 2113 12/19/18 0751 12/19/18 0752 12/19/18 1933 12/20/18 0521  NA  --  139 140 137 137  --   --  136  K  --  4.0 4.0 2.9* 3.0*  --   --  2.9*  CL  --  97* 99 97* 97*  --   --  99  CO2  --  24 25 27 24   --   --  25  GLUCOSE  --  117* 102* 110* 117*  --   --  171*  BUN  --  92* 97* 30* 41*  --   --  48*  CREATININE  --  7.59* 8.31* 3.06* 4.69*  --   --  5.71*  CALCIUM  --  8.5* 8.9 8.3* 8.6*  --   --  8.8*  MG  --   --   --  1.8  --  1.8 1.8  --   PHOS 7.0* 5.0*  --  2.1*  --  3.2 3.8  --     Liver Function Tests: Recent Labs  Lab 12/10/2018 0439 12/18/18 2113  AST 15 22  ALT 10 14  ALKPHOS 77 88  BILITOT 0.9 0.7  PROT 6.2* 6.2*   ALBUMIN 2.1* 2.0*   No results for input(s): LIPASE, AMYLASE in the last 168 hours. No results for input(s): AMMONIA in the last 168 hours.  CBC: Recent Labs  Lab 12/09/2018 2017 12/15/18 0923  12/17/18 1702 12/18/18 0331 12/18/18 2113 12/19/18 0751 12/20/18 0521  WBC 9.7 5.8   < > 10.3 6.9 6.1 6.8 9.8  NEUTROABS 8.0* 4.3  --   --  4.7  --  4.7 7.4  HGB 8.5* 7.8*   < > 8.7* 8.2* 8.2* 7.9* 8.2*  HCT 28.3* 25.6*   < > 29.0* 27.7* 27.6* 27.0* 28.1*  MCV 89.3 89.5   < > 90.1 90.2 90.2 91.2 92.1  PLT 44* 37*   < > 46* 42* 38* 40* 47*   < > = values in this interval not displayed.    Cardiac Enzymes: No results for input(s): CKTOTAL, CKMB, CKMBINDEX, TROPONINI in the last 168 hours.  BNP: Invalid input(s): POCBNP  CBG: Recent Labs  Lab 12/14/2018 1536 12/21/2018 1539 12/22/2018 1558 12/17/18 1411  BWGYKZ 99* 35* 701* 77    Microbiology: Results for orders placed or performed during the hospital encounter of 12/09/2018  CULTURE, BLOOD (ROUTINE X 2) w Reflex to ID Panel     Status: Abnormal   Collection Time: 12/15/18  3:09 PM  Result Value Ref Range Status   Specimen Description   Final    BLOOD LEFT HAND Performed at [facility], [address], [place], [place] [address]    Special Requests   Final    AEROBIC BOTTLE ONLY Blood Culture results may not be optimal due to an inadequate volume of blood received in culture bottles Performed at [facility], [address], [place], [place] [address]    Culture  Setup Time   Final    GRAM POSITIVE COCCI AEROBIC BOTTLE ONLY CRITICAL VALUE NOTED.  VALUE IS CONSISTENT WITH PREVIOUSLY REPORTED AND CALLED VALUE. Performed at [facility], [address], [place], [place] [address]    Culture (A)  Final    ENTEROCOCCUS FAECALIS SUSCEPTIBILITIES PERFORMED ON PREVIOUS CULTURE WITHIN THE LAST 5 DAYS. Performed at [facility], [place] [address], [place], [place] [address]    Report Status  12/18/2018 FINAL  Final  CULTURE, BLOOD (ROUTINE X 2) w Reflex to ID Panel     Status: Abnormal   Collection Time: 12/15/18  3:16 PM  Result Value Ref Range Status   Specimen Description   Final    BLOOD RIGHT HAND Performed at [facility], [address] [place], [place] [address]    Special Requests   Final    BOTTLES DRAWN AEROBIC AND ANAEROBIC Blood Culture results may not be optimal due to an inadequate volume of blood received in culture bottles Performed at [facility], [address], [place], [place] [address]    Culture  Setup Time   Final    GRAM POSITIVE COCCI IN BOTH AEROBIC AND ANAEROBIC BOTTLES CRITICAL RESULT CALLED TO, READ BACK BY AND VERIFIED WITH: Vita Barley HALLHAI 12/19/2018 0448 REC Performed at [facility], [place] [address] [place], [place] [address]    Culture ENTEROCOCCUS FAECALIS (A)  Final   Report Status 12/18/2018 FINAL  Final   Organism ID, Bacteria ENTEROCOCCUS FAECALIS  Final      Susceptibility   Enterococcus faecalis - MIC*    AMPICILLIN <=2 SENSITIVE Sensitive     VANCOMYCIN 1 SENSITIVE Sensitive     GENTAMICIN SYNERGY SENSITIVE Sensitive     * ENTEROCOCCUS FAECALIS  Blood Culture ID Panel (Reflexed)     Status: Abnormal   Collection Time: 12/15/18  3:16 PM  Result Value Ref Range Status   Enterococcus species DETECTED (A) NOT DETECTED Final    Comment: CRITICAL RESULT CALLED TO, READ BACK BY AND VERIFIED WITH: SHEENA HALLHAI 12/10/2018 0448 REC    Vancomycin resistance NOT DETECTED NOT DETECTED Final   Listeria monocytogenes NOT DETECTED NOT DETECTED Final   Staphylococcus species NOT DETECTED NOT DETECTED Final   Staphylococcus aureus (BCID) NOT DETECTED NOT DETECTED Final   Streptococcus species NOT DETECTED NOT DETECTED Final   Streptococcus agalactiae NOT DETECTED NOT DETECTED Final   Streptococcus pneumoniae NOT DETECTED NOT DETECTED Final   Streptococcus pyogenes NOT DETECTED NOT DETECTED Final   Acinetobacter  baumannii NOT DETECTED NOT DETECTED Final   Enterobacteriaceae species NOT DETECTED NOT DETECTED Final   Enterobacter cloacae complex NOT DETECTED NOT DETECTED Final   Escherichia coli NOT DETECTED NOT DETECTED Final   Klebsiella oxytoca  NOT DETECTED NOT DETECTED Final   Klebsiella pneumoniae NOT DETECTED NOT DETECTED Final   Proteus species NOT DETECTED NOT DETECTED Final   Serratia marcescens NOT DETECTED NOT DETECTED Final   Haemophilus influenzae NOT DETECTED NOT DETECTED Final   Neisseria meningitidis NOT DETECTED NOT DETECTED Final   Pseudomonas aeruginosa NOT DETECTED NOT DETECTED Final   Candida albicans NOT DETECTED NOT DETECTED Final   Candida glabrata NOT DETECTED NOT DETECTED Final   Candida krusei NOT DETECTED NOT DETECTED Final   Candida parapsilosis NOT DETECTED NOT DETECTED Final   Candida tropicalis NOT DETECTED NOT DETECTED Final    Comment: Performed at [facility], [place], [place] [address]  Surgical PCR screen     Status: None   Collection Time: 12/17/2018  4:49 AM  Result Value Ref Range Status   MRSA, PCR NEGATIVE NEGATIVE Final   Staphylococcus aureus NEGATIVE NEGATIVE Final    Comment: (NOTE) The Xpert SA Assay (FDA approved for NASAL specimens in patients 20 years of age and older), is one component of a comprehensive surveillance program. It is not intended to diagnose infection nor to guide or monitor treatment. Performed at [facility], [address] [place], [place] [address]   Cath Tip Culture     Status: Abnormal (Preliminary result)   Collection Time: 12/12/2018  4:38 PM  Result Value Ref Range Status   Specimen Description   Final    CATH TIP Performed at [facility], [address] [place], [place] [address]    Special Requests   Final    NONE Performed at [facility], [address] [place], [place] [address]    Culture (A)  Final    ENTEROCOCCUS FAECALIS STAPHYLOCOCCUS  EPIDERMIDIS    Report Status PENDING  Incomplete  C difficile quick scan w PCR reflex     Status: None   Collection Time: 12/17/18  6:25 AM  Result Value Ref Range Status   C Diff antigen NEGATIVE NEGATIVE Final   C Diff toxin NEGATIVE NEGATIVE Final   C Diff interpretation No C. difficile detected.  Final    Comment: Performed at [facility], [address] [place], [place] [address]  CULTURE, BLOOD (ROUTINE X 2) w Reflex to ID Panel     Status: None (Preliminary result)   Collection Time: 12/17/18  5:02 PM  Result Value Ref Range Status   Specimen Description BLOOD PORTA CATH  Final   Special Requests   Final    BOTTLES DRAWN AEROBIC AND ANAEROBIC Blood Culture adequate volume   Culture   Final    NO GROWTH 3 DAYS Performed at [facility], [address] [place], [place] [address]    Report Status PENDING  Incomplete  CULTURE, BLOOD (ROUTINE X 2) w Reflex to ID Panel     Status: None (Preliminary result)   Collection Time: 12/18/18  3:30 AM  Result Value Ref Range Status   Specimen Description BLOOD RIGHT ANTECUBITAL  Final   Special Requests   Final    BOTTLES DRAWN AEROBIC AND ANAEROBIC Blood Culture results may not be optimal due to an excessive volume of blood received in culture bottles   Culture   Final    NO GROWTH 2 DAYS Performed at [facility], [address] [place], [place] [address]    Report Status PENDING  Incomplete    Coagulation Studies: No results for input(s): LABPROT, INR in the last 72 hours.  Urinalysis: No results for input(s): COLORURINE, LABSPEC, PHURINE, GLUCOSEU, HGBUR, BILIRUBINUR, KETONESUR, PROTEINUR, UROBILINOGEN, NITRITE, LEUKOCYTESUR in the last 72 hours.  Invalid input(s): APPERANCEUR    Imaging: No results found.   Medications:   . sodium chloride Stopped (11/30/2018 0834)  . sodium chloride    . ampicillin (OMNIPEN) IV Stopped (12/20/18 0540)  . anticoagulant sodium citrate    . argatroban  0.35 mcg/kg/min (12/20/18 0558)  . cefTRIAXone (ROCEPHIN)  IV 2 g (12/20/18 1125)  . norepinephrine (LEVOPHED) Adult infusion 8 mcg/min (12/20/18 1218)  . potassium chloride 10 mEq (12/20/18 1030)   . buPROPion  300 mg Oral Daily  . Chlorhexidine Gluconate Cloth  6 each Topical Q0600  . cinacalcet  30 mg Oral Q breakfast  . epoetin (EPOGEN/PROCRIT) injection  10,000 Units Intravenous Q M,W,F-HD  . ferric citrate  420 mg Oral TID  . lactobacillus  1 g Oral TID WC  . levothyroxine  75 mcg Oral QAC breakfast  . midodrine  5 mg Oral TID WC  . multivitamin  1 tablet Oral QHS  . pantoprazole (PROTONIX) IV  40 mg Intravenous QHS  . sevelamer carbonate  1,600 mg Oral TID WC   sodium chloride, sodium chloride, acetaminophen **OR** acetaminophen, alteplase, anticoagulant sodium citrate, HYDROcodone-acetaminophen, HYDROmorphone (DILAUDID) injection, HYDROmorphone (DILAUDID) injection, iohexol, lidocaine (PF), lidocaine-prilocaine, meclizine, ondansetron (ZOFRAN) IV, ondansetron (ZOFRAN) IV, ondansetron **OR** [DISCONTINUED] ondansetron (ZOFRAN) IV, pentafluoroprop-tetrafluoroeth, polyethylene glycol  Assessment/ Plan:  48 y.o. female with past medical history of ESRD on HD, morbid obesity, diabetes mellitus type 2, hypertension, anemia of chronic kidney disease, superior vena cava syndrome. admitted with non functional permcath and sepsis.  [facility]/MWF2/EDW 176.5  1.  ESRD on HD MWF.  Patient completed hemodialysis on Friday.  No urgent dictation for dialysis at the moment.  We will plan for hemodialysis tomorrow using pressors to maintain blood pressure.  2.  Anemia of chronic kidney disease/thrombocytopenia.  Hemoglobin currently 8.2.  Start the patient on Epogen 10,000 units IV with dialysis tomorrow.  3.  Secondary hyperparathyroidism.  Phosphorus 3.8 at last check.  Continue to monitor.  4.  Sepsis/hypotension.  Enterococcus faecalis growing in the blood.  Patient maintained on  norepinephrine to maintain blood pressure.  Prior IJ permcath removed.  Pt has SVC syndrome and will likely prove very difficult to replace an IJ permcath.    LOS: 6 Napolean Sia 3/22/20201:28 PM

## 2018-12-20 NOTE — Progress Notes (Addendum)
[facility] for Argatroban Drip Management  Indication: Possible HIT  Allergies  Allergen Reactions  . Heparin     R/O HIT; initiated 12/17/28  . Morphine And Related   . Naproxen     Patient Measurements: Height: 5\' 4"  (162.6 cm) Weight: (!) 397 lb 7.8 oz (180.3 kg) IBW/kg (Calculated) : 54.7  Vital Signs: Temp: 98.6 F (37 C) (03/22 0000) Temp Source: Axillary (03/22 0000) BP: 93/67 (03/22 0500) Pulse Rate: 78 (03/22 0300)  Labs: Recent Labs    12/18/18 2113 12/19/18 0751 12/19/18 1933 12/20/18 0521  HGB 8.2* 7.9*  --  8.2*  HCT 27.6* 27.0*  --  28.1*  PLT 38* 40*  --  47*  APTT  --  88* 97* 102*  CREATININE 3.06* 4.69*  --  5.71*    Estimated Creatinine Clearance: 19.7 mL/min (A) (by C-G formula based on SCr of 5.71 mg/dL (H)).   Medical History: Past Medical History:  Diagnosis Date  . Anemia of chronic disease   . Chest pain    a. 11/2006 Cath: minimal nonobs dzs; b. 08/2018 MV: EF 64%, no ischemia.  . Chronic hypotension   . Chronic right hip pain   . Depression   . Diastolic dysfunction    a. 06/2016 Echo: EF 55-60%, Gr1 DD; b. 03/2017 Echo: EF 55-60%, LVH. Mild TR; c. 11/2018 Echo: EF 60-65%, mild conc LVH. Mildly dil RA. Triv AI.  [name] ESRD (end stage renal disease) ([place])    a. Long h/o graft access difficulties.  . Hyperkalemia   . Hyperlipidemia   . Hypothyroidism   . Sleep apnea   . Super obesity   . Type II diabetes mellitus (HCC)     Medications:  Scheduled:  . buPROPion  300 mg Oral Daily  . Chlorhexidine Gluconate Cloth  6 each Topical Q0600  . cinacalcet  30 mg Oral Q breakfast  . docusate sodium  100 mg Oral Daily  . epoetin (EPOGEN/PROCRIT) injection  10,000 Units Intravenous Q M,W,F-HD  . feeding supplement (NEPRO CARB STEADY)  237 mL Oral BID BM  . ferric citrate  420 mg Oral TID  . lactobacillus  1 g Oral TID WC  . levothyroxine  75 mcg Oral QAC breakfast  . midodrine  5 mg Oral TID WC  .  multivitamin  1 tablet Oral QHS  . pantoprazole (PROTONIX) IV  40 mg Intravenous QHS  . sevelamer carbonate  1,600 mg Oral TID WC  . sodium chloride flush  3 mL Intravenous Q12H   Infusions:  . sodium chloride Stopped (12/01/2018 0834)  . sodium chloride    . sodium chloride    . sodium chloride    . sodium chloride    . ampicillin (OMNIPEN) IV 2 g (12/20/18 0518)  . anticoagulant sodium citrate    . argatroban 0.5 mcg/kg/min (12/20/18 0307)  . cefTRIAXone (ROCEPHIN)  IV Stopped (12/19/18 2318)  . norepinephrine (LEVOPHED) Adult infusion 14 mcg/min (12/20/18 0548)  . sodium chloride      Assessment: Pharmacy consulted for argatroban drip for 48 yo ICU patient requiring a norepinephrine infusion and received dialysis. Patient received dialysis on 3/20. Patient is being evaluated for possible HIT.  Patient initiated on argatroban 0.71mcg/kg/min. First two aPTTs are in therapeutic range. Will obtain aPTT with am labs. Will plan to obtain aPTT Q12hr while patient is in therapeutic range and in ICU.   3/21 aPTT @0751 = 88. Will continue current drip rate. Next aptt in 12  hrs.   Will continue to evaluate heparin induced platelet antibody. HIT lab ordered 3/20   3/21: aPTT @ 1929 = 97.  Will continue pt on current rate and recheck aPTT on 3/22 @ 0500.   Goal of Therapy:  aPTT 50-90 seconds Monitor platelets by anticoagulation protocol: Yes   Plan:  3/22 @ 0521 aPTT 102. Level is supratherapeutic. Will decrease infusion rate by 30%. New infusion rate 0.50mcg/kg/min (~63 mcg/min)  Will recheck aPTT level in 2 hours .   Pharmacy will continue to monitor and adjust per consult.   Pernell Dupre, PharmD, BCPS Clinical Pharmacist 12/20/2018 5:56 AM

## 2018-12-20 NOTE — Progress Notes (Signed)
ANTICOAGULATION CONSULT NOTE  Pharmacy Consult for Argatroban Drip Management  Indication: Possible HIT  Patient Measurements: Height: 5\' 4"  (162.6 cm) Weight: (!) 397 lb 7.8 oz (180.3 kg) IBW/kg (Calculated) : 54.7  Vital Signs: Temp: 97.8 F (36.6 C) (03/22 1530) Temp Source: Axillary (03/22 1300) BP: 78/59 (03/22 1900) Pulse Rate: 90 (03/22 1830)  Labs: Recent Labs    12/18/18 2113 12/19/18 0751  12/20/18 0521 12/20/18 0929 12/20/18 1314 12/20/18 1852  HGB 8.2* 7.9*  --  8.2*  --   --   --   HCT 27.6* 27.0*  --  28.1*  --   --   --   PLT 38* 40*  --  47*  --   --   --   APTT  --  88*   < > 102* 77* 93* 82*  CREATININE 3.06* 4.69*  --  5.71*  --   --   --    < > = values in this interval not displayed.    Estimated Creatinine Clearance: 19.7 mL/min (A) (by C-G formula based on SCr of 5.71 mg/dL (H)).   Medical History: Past Medical History:  Diagnosis Date  . Anemia of chronic disease   . Chest pain    a. 11/2006 Cath: minimal nonobs dzs; b. 08/2018 MV: EF 64%, no ischemia.  . Chronic hypotension   . Chronic right hip pain   . Depression   . Diastolic dysfunction    a. 06/2016 Echo: EF 55-60%, Gr1 DD; b. 03/2017 Echo: EF 55-60%, LVH. Mild TR; c. 11/2018 Echo: EF 60-65%, mild conc LVH. Mildly dil RA. Triv AI.  [name] ESRD (end stage renal disease) ([place])    a. Long h/o graft access difficulties.  . Hyperkalemia   . Hyperlipidemia   . Hypothyroidism   . Sleep apnea   . Super obesity   . Type II diabetes mellitus (HCC)     Medications:  Scheduled:  . buPROPion  300 mg Oral Daily  . Chlorhexidine Gluconate Cloth  6 each Topical Q0600  . cinacalcet  30 mg Oral Q breakfast  . epoetin (EPOGEN/PROCRIT) injection  10,000 Units Intravenous Q M,W,F-HD  . ferric citrate  420 mg Oral TID  . lactobacillus  1 g Oral TID WC  . levothyroxine  75 mcg Oral QAC breakfast  . midodrine  5 mg Oral TID WC  . multivitamin  1 tablet Oral QHS  . pantoprazole (PROTONIX) IV  40 mg  Intravenous QHS  . potassium chloride  40 mEq Oral Once  . sevelamer carbonate  1,600 mg Oral TID WC   Infusions:  . sodium chloride 500 mL (12/20/18 1649)  . sodium chloride    . ampicillin (OMNIPEN) IV 2 g (12/20/18 1830)  . anticoagulant sodium citrate    . argatroban 0.3 mcg/kg/min (12/20/18 1542)  . cefTRIAXone (ROCEPHIN)  IV 2 g (12/20/18 1125)  . norepinephrine (LEVOPHED) Adult infusion 3 mcg/min (12/20/18 1646)    Assessment: Pharmacy consulted for argatroban drip for 48 yo ICU patient requiring a norepinephrine infusion and received dialysis. Patient received dialysis on 3/20. Patient is being evaluated for possible HIT.  Patient initiated on argatroban 0.16mcg/kg/min. First two aPTTs are in therapeutic range. Will obtain aPTT with am labs. Will plan to obtain aPTT Q12hr while patient is in therapeutic range and in ICU. Original infusion rate 0.5 mcg/kg/min  Goal of Therapy:  aPTT 50-90 seconds Monitor platelets by anticoagulation protocol: Yes   Course of Therapy 03/20 1518 aPTT 88s 03/20 1839  aPTT 84s 03/21 0751 aPTT 88s 03/21 1933 aPTT 97s 03/22 0521 aPTT 102s: decreased to 0.35 mcg/kg/min 03/22 0929 aPTT 77s 03/22 1314 aPTT 93s   Plan:  --decrease infusion to 0.30 mcg/kg/min  --recheck aPTT level in 2 hours following rate change  3/22:  APTT @ 1852 = 82 Will continue pt on current rate and draw confirmation level in 4 hrs on 3/23 @ 0000.   Pharmacy will continue to monitor and adjust per consult.   Orene Desanctis, PharmD Clinical Pharmacist 12/20/2018 7:45 PM

## 2018-12-21 DIAGNOSIS — R6521 Severe sepsis with septic shock: Secondary | ICD-10-CM

## 2018-12-21 DIAGNOSIS — I959 Hypotension, unspecified: Secondary | ICD-10-CM

## 2018-12-21 DIAGNOSIS — A419 Sepsis, unspecified organism: Secondary | ICD-10-CM

## 2018-12-21 DIAGNOSIS — G9341 Metabolic encephalopathy: Secondary | ICD-10-CM

## 2018-12-21 LAB — BASIC METABOLIC PANEL
Anion gap: 13 (ref 5–15)
BUN: 54 mg/dL — ABNORMAL HIGH (ref 6–20)
CO2: 24 mmol/L (ref 22–32)
CREATININE: 6.46 mg/dL — AB (ref 0.44–1.00)
Calcium: 8.6 mg/dL — ABNORMAL LOW (ref 8.9–10.3)
Chloride: 99 mmol/L (ref 98–111)
GFR calc Af Amer: 8 mL/min — ABNORMAL LOW (ref >60)
GFR calc non Af Amer: 7 mL/min — ABNORMAL LOW (ref >60)
Glucose, Bld: 127 mg/dL — ABNORMAL HIGH (ref 70–99)
Potassium: 3.4 mmol/L — ABNORMAL LOW (ref 3.5–5.1)
Sodium: 136 mmol/L (ref 135–145)

## 2018-12-21 LAB — APTT
APTT: 79 s — AB (ref 24–36)
aPTT: 96 seconds — ABNORMAL HIGH (ref 24–36)
aPTT: 84 seconds — ABNORMAL HIGH (ref 24–36)

## 2018-12-21 LAB — CBC WITH DIFFERENTIAL/PLATELET
Abs Immature Granulocytes: 0.47 10*3/uL — ABNORMAL HIGH (ref 0.00–0.07)
BASOS ABS: 0 10*3/uL (ref 0.0–0.1)
Basophils Relative: 0 %
Eosinophils Absolute: 0.1 10*3/uL (ref 0.0–0.5)
Eosinophils Relative: 1 %
HCT: 27.4 % — ABNORMAL LOW (ref 36.0–46.0)
Hemoglobin: 8 g/dL — ABNORMAL LOW (ref 12.0–15.0)
Immature Granulocytes: 5 %
Lymphocytes Relative: 11 %
Lymphs Abs: 1.1 10*3/uL (ref 0.7–4.0)
MCH: 26.8 pg (ref 26.0–34.0)
MCHC: 29.2 g/dL — ABNORMAL LOW (ref 30.0–36.0)
MCV: 91.6 fL (ref 80.0–100.0)
Monocytes Absolute: 0.2 10*3/uL (ref 0.1–1.0)
Monocytes Relative: 2 %
Neutro Abs: 8.4 10*3/uL — ABNORMAL HIGH (ref 1.7–7.7)
Neutrophils Relative %: 81 %
Platelets: 49 10*3/uL — ABNORMAL LOW (ref 150–400)
RBC: 2.99 MIL/uL — ABNORMAL LOW (ref 3.87–5.11)
RDW: 17.2 % — ABNORMAL HIGH (ref 11.5–15.5)
WBC: 10.3 10*3/uL (ref 4.0–10.5)
nRBC: 0.7 % — ABNORMAL HIGH (ref 0.0–0.2)

## 2018-12-21 LAB — MAGNESIUM: Magnesium: 2 mg/dL (ref 1.7–2.4)

## 2018-12-21 LAB — GLUCOSE, CAPILLARY
Glucose-Capillary: 99 mg/dL (ref 70–99)
Glucose-Capillary: 93 mg/dL (ref 70–99)
Glucose-Capillary: 97 mg/dL (ref 70–99)

## 2018-12-21 LAB — PHOSPHORUS: Phosphorus: 4.7 mg/dL — ABNORMAL HIGH (ref 2.5–4.6)

## 2018-12-21 MED ORDER — INSULIN ASPART 100 UNIT/ML ~~LOC~~ SOLN
0.0000 [IU] | SUBCUTANEOUS | Status: DC
  Administered 2018-12-22: 1 [IU] via SUBCUTANEOUS
  Administered 2018-12-22: 2 [IU] via SUBCUTANEOUS
  Administered 2018-12-22 – 2018-12-23 (×2): 1 [IU] via SUBCUTANEOUS
  Filled 2018-12-21 (×2): qty 1

## 2018-12-21 MED ORDER — CLINDAMYCIN PHOSPHATE 2 % VA CREA: 1.0000 | TOPICAL_CREAM | Freq: Every day | VAGINAL | Status: DC

## 2018-12-21 MED ORDER — METRONIDAZOLE 0.75 % VA GEL
1.0000 | Freq: Every day | VAGINAL | Status: DC
  Administered 2018-12-22 (×2): 1 via VAGINAL
  Filled 2018-12-21: qty 70

## 2018-12-21 MED ORDER — HYDROMORPHONE HCL 1 MG/ML IJ SOLN
1.0000 mg | INTRAMUSCULAR | Status: DC | PRN
  Administered 2018-12-21 – 2018-12-22 (×3): 1 mg via INTRAVENOUS
  Filled 2018-12-21 (×3): qty 1

## 2018-12-21 MED ORDER — ORAL CARE MOUTH RINSE
15.0000 mL | Freq: Two times a day (BID) | OROMUCOSAL | Status: DC
  Administered 2018-12-21 – 2018-12-22 (×3): 15 mL via OROMUCOSAL

## 2018-12-21 NOTE — Progress Notes (Addendum)
ANTICOAGULATION CONSULT NOTE  Pharmacy Consult for Argatroban Drip Management  Indication: Possible HIT  Patient Measurements: Height: 5\' 4"  (162.6 cm) Weight: (!) 397 lb 7.8 oz (180.3 kg) IBW/kg (Calculated) : 54.7  Vital Signs: Temp: 97.3 F (36.3 C) (03/22 2000) Temp Source: Axillary (03/22 2000) BP: 89/66 (03/23 0345) Pulse Rate: 85 (03/23 0315)  Labs: Recent Labs    12/18/18 2113 12/19/18 0751  12/20/18 0521  12/20/18 1852 12/21/18 0045 12/21/18 0447  HGB 8.2* 7.9*  --  8.2*  --   --   --  8.0*  HCT 27.6* 27.0*  --  28.1*  --   --   --  27.4*  PLT 38* 40*  --  47*  --   --   --  49*  APTT  --  88*   < > 102*   < > 82* 96* 79*  CREATININE 3.06* 4.69*  --  5.71*  --   --   --   --    < > = values in this interval not displayed.    Estimated Creatinine Clearance: 19.7 mL/min (A) (by C-G formula based on SCr of 5.71 mg/dL (H)).   Medical History: Past Medical History:  Diagnosis Date  . Anemia of chronic disease   . Chest pain    a. 11/2006 Cath: minimal nonobs dzs; b. 08/2018 MV: EF 64%, no ischemia.  . Chronic hypotension   . Chronic right hip pain   . Depression   . Diastolic dysfunction    a. 06/2016 Echo: EF 55-60%, Gr1 DD; b. 03/2017 Echo: EF 55-60%, LVH. Mild TR; c. 11/2018 Echo: EF 60-65%, mild conc LVH. Mildly dil RA. Triv AI.  [name] ESRD (end stage renal disease) ([place])    a. Long h/o graft access difficulties.  . Hyperkalemia   . Hyperlipidemia   . Hypothyroidism   . Sleep apnea   . Super obesity   . Type II diabetes mellitus (HCC)     Medications:  Scheduled:  . buPROPion  300 mg Oral Daily  . Chlorhexidine Gluconate Cloth  6 each Topical Q0600  . cinacalcet  30 mg Oral Q breakfast  . epoetin (EPOGEN/PROCRIT) injection  10,000 Units Intravenous Q M,W,F-HD  . ferric citrate  420 mg Oral TID  . lactobacillus  1 g Oral TID WC  . levothyroxine  75 mcg Oral QAC breakfast  . midodrine  5 mg Oral TID WC  . multivitamin  1 tablet Oral QHS  .  pantoprazole (PROTONIX) IV  40 mg Intravenous QHS  . potassium chloride  40 mEq Oral Once  . sevelamer carbonate  1,600 mg Oral TID WC   Infusions:  . sodium chloride Stopped (12/20/18 1830)  . sodium chloride    . ampicillin (OMNIPEN) IV Stopped (12/20/18 1850)  . anticoagulant sodium citrate    . argatroban 0.2 mcg/kg/min (12/21/18 0300)  . cefTRIAXone (ROCEPHIN)  IV Stopped (12/20/18 2323)  . norepinephrine (LEVOPHED) Adult infusion 10 mcg/min (12/21/18 0300)    Assessment: Pharmacy consulted for argatroban drip for 48 yo ICU patient requiring a norepinephrine infusion and received dialysis. Patient received dialysis on 3/20. Patient is being evaluated for possible HIT.  Patient initiated on argatroban 0.8mcg/kg/min. First two aPTTs are in therapeutic range. Will obtain aPTT with am labs. Will plan to obtain aPTT Q12hr while patient is in therapeutic range and in ICU. Original infusion rate 0.5 mcg/kg/min  Goal of Therapy:  aPTT 50-90 seconds Monitor platelets by anticoagulation protocol: Yes   Course  of Therapy 03/20 1518 aPTT 88s 03/20 1839 aPTT 84s 03/21 0751 aPTT 88s 03/21 1933 aPTT 97s 03/22 0521 aPTT 102s: decreased to 0.35 mcg/kg/min 03/22 0929 aPTT 77s 03/22 1314 aPTT 93s Decreased to 0.30 mcg/kg/min 03/22 1852 aPTT 82 s 03/23  0045 aPTT 96s 03/23 0447 aPTT 79s   Plan:  3/23 @ 0447 aPTT therapeutic @ 79s. Will continue infusion rate of 0.20 mcg/kg/min and will recheck confirmatory aPTT level in 2 hours. Continue to order CBCs daily per protocol.  Pharmacy will continue to monitor and adjust per consult.   Pernell Dupre, PharmD, BCPS Clinical Pharmacist 12/21/2018 5:12 AM

## 2018-12-21 NOTE — Progress Notes (Signed)
CRITICAL CARE NOTE  CC  follow up respiratory failure Follow up septic shock  SUBJECTIVE Patient remains critically ill Prognosis is guarded Lethargic High risk for cardiac arrest On vasopressors ESRD on HD +decubitis ulcers  SIGNIFICANT EVENTS 3.23 remains on pressors   REVIEW OF SYSTEMS  PATIENT IS UNABLE TO PROVIDE COMPLETE REVIEW OF SYSTEMS DUE TO SEVERE CRITICAL ILLNESS   PHYSICAL EXAMINATION:  GENERAL:critically ill appearing, +resp distress morbidly obese HEAD: Normocephalic, atraumatic.  EYES: Pupils equal, round, reactive to light.  No scleral icterus.  MOUTH: Moist mucosal membrane. NECK: Supple. No thyromegaly. No nodules. No JVD.  PULMONARY: +rhonchi,  CARDIOVASCULAR: S1 and S2. Regular rate and rhythm. No murmurs, rubs, or gallops.  GASTROINTESTINAL: Soft, nontender, -distended. No masses. Positive bowel sounds. No hepatosplenomegaly.  MUSCULOSKELETAL: + edema.  NEUROLOGIC: lethargic SKIN:multiple ulceration sacral superficial, painful, erythematous    CULTURE RESULTS   Recent Results (from the past 240 hour(s))  CULTURE, BLOOD (ROUTINE X 2) w Reflex to ID Panel     Status: Abnormal   Collection Time: 12/15/18  3:09 PM  Result Value Ref Range Status   Specimen Description   Final    BLOOD LEFT HAND Performed at [facility], [address] [place], [place] [address]    Special Requests   Final    AEROBIC BOTTLE ONLY Blood Culture results may not be optimal due to an inadequate volume of blood received in culture bottles Performed at [facility], [address], [place], [place] [address]    Culture  Setup Time   Final    GRAM POSITIVE COCCI AEROBIC BOTTLE ONLY CRITICAL VALUE NOTED.  VALUE IS CONSISTENT WITH PREVIOUSLY REPORTED AND CALLED VALUE. Performed at [facility], [address], [place], [place] [address]    Culture (A)  Final    ENTEROCOCCUS FAECALIS SUSCEPTIBILITIES PERFORMED ON PREVIOUS CULTURE WITHIN  THE LAST 5 DAYS. Performed at [facility], [place] [address] [place], [place] [address]    Report Status 12/18/2018 FINAL  Final  CULTURE, BLOOD (ROUTINE X 2) w Reflex to ID Panel     Status: Abnormal   Collection Time: 12/15/18  3:16 PM  Result Value Ref Range Status   Specimen Description   Final    BLOOD RIGHT HAND Performed at [facility], [address] [place], [place] [address]    Special Requests   Final    BOTTLES DRAWN AEROBIC AND ANAEROBIC Blood Culture results may not be optimal due to an inadequate volume of blood received in culture bottles Performed at [facility], [address], [place], [place] [address]    Culture  Setup Time   Final    GRAM POSITIVE COCCI IN BOTH AEROBIC AND ANAEROBIC BOTTLES CRITICAL RESULT CALLED TO, READ BACK BY AND VERIFIED WITH: Vita Barley HALLHAI 12/02/2018 0448 REC Performed at [facility], [place] [address] [place], [place] [address]    Culture ENTEROCOCCUS FAECALIS (A)  Final   Report Status 12/18/2018 FINAL  Final   Organism ID, Bacteria ENTEROCOCCUS FAECALIS  Final      Susceptibility   Enterococcus faecalis - MIC*    AMPICILLIN <=2 SENSITIVE Sensitive     VANCOMYCIN 1 SENSITIVE Sensitive     GENTAMICIN SYNERGY SENSITIVE Sensitive     * ENTEROCOCCUS FAECALIS  Blood Culture ID Panel (Reflexed)     Status: Abnormal   Collection Time: 12/15/18  3:16 PM  Result Value Ref Range Status   Enterococcus species DETECTED (A)  NOT DETECTED Final    Comment: CRITICAL RESULT CALLED TO, READ BACK BY AND VERIFIED WITH: SHEENA HALLHAI 12/24/2018 0448 REC    Vancomycin resistance NOT DETECTED NOT DETECTED Final   Listeria monocytogenes NOT DETECTED NOT DETECTED Final   Staphylococcus species NOT DETECTED NOT DETECTED Final   Staphylococcus aureus (BCID) NOT DETECTED NOT DETECTED Final   Streptococcus species NOT DETECTED NOT DETECTED Final   Streptococcus agalactiae NOT DETECTED NOT DETECTED Final   Streptococcus  pneumoniae NOT DETECTED NOT DETECTED Final   Streptococcus pyogenes NOT DETECTED NOT DETECTED Final   Acinetobacter baumannii NOT DETECTED NOT DETECTED Final   Enterobacteriaceae species NOT DETECTED NOT DETECTED Final   Enterobacter cloacae complex NOT DETECTED NOT DETECTED Final   Escherichia coli NOT DETECTED NOT DETECTED Final   Klebsiella oxytoca NOT DETECTED NOT DETECTED Final   Klebsiella pneumoniae NOT DETECTED NOT DETECTED Final   Proteus species NOT DETECTED NOT DETECTED Final   Serratia marcescens NOT DETECTED NOT DETECTED Final   Haemophilus influenzae NOT DETECTED NOT DETECTED Final   Neisseria meningitidis NOT DETECTED NOT DETECTED Final   Pseudomonas aeruginosa NOT DETECTED NOT DETECTED Final   Candida albicans NOT DETECTED NOT DETECTED Final   Candida glabrata NOT DETECTED NOT DETECTED Final   Candida krusei NOT DETECTED NOT DETECTED Final   Candida parapsilosis NOT DETECTED NOT DETECTED Final   Candida tropicalis NOT DETECTED NOT DETECTED Final    Comment: Performed at [facility], [place], [place] [address]  Surgical PCR screen     Status: None   Collection Time: 12/13/2018  4:49 AM  Result Value Ref Range Status   MRSA, PCR NEGATIVE NEGATIVE Final   Staphylococcus aureus NEGATIVE NEGATIVE Final    Comment: (NOTE) The Xpert SA Assay (FDA approved for NASAL specimens in patients 71 years of age and older), is one component of a comprehensive surveillance program. It is not intended to diagnose infection nor to guide or monitor treatment. Performed at [facility], [address] [place], [place] [address]   Cath Tip Culture     Status: Abnormal   Collection Time: 12/06/2018  4:38 PM  Result Value Ref Range Status   Specimen Description   Final    CATH TIP Performed at [facility], [address] [place], [place] [address]    Special Requests   Final    NONE Performed at [facility], [address] [place], [place] [address]    Culture (A)  Final    ENTEROCOCCUS FAECALIS STAPHYLOCOCCUS EPIDERMIDIS    Report Status 12/20/2018 FINAL  Final   Organism ID, Bacteria ENTEROCOCCUS FAECALIS  Final   Organism ID, Bacteria STAPHYLOCOCCUS EPIDERMIDIS  Final      Susceptibility   Enterococcus faecalis - MIC*    AMPICILLIN <=2 SENSITIVE Sensitive     VANCOMYCIN 1 SENSITIVE Sensitive     GENTAMICIN SYNERGY SENSITIVE Sensitive     * ENTEROCOCCUS FAECALIS   Staphylococcus epidermidis - MIC*    CIPROFLOXACIN 4 RESISTANT Resistant     ERYTHROMYCIN >=8 RESISTANT Resistant     GENTAMICIN <=0.5 SENSITIVE Sensitive     OXACILLIN >=4 RESISTANT Resistant     TETRACYCLINE 2 SENSITIVE Sensitive     VANCOMYCIN 2 SENSITIVE Sensitive     TRIMETH/SULFA <=10 SENSITIVE Sensitive     CLINDAMYCIN <=0.25 SENSITIVE Sensitive     RIFAMPIN <=0.5 SENSITIVE Sensitive     Inducible Clindamycin NEGATIVE Sensitive     * STAPHYLOCOCCUS EPIDERMIDIS  C difficile quick scan w PCR reflex     Status: None   Collection Time: 12/17/18  6:25 AM  Result Value Ref Range Status   C Diff antigen NEGATIVE NEGATIVE Final   C Diff toxin NEGATIVE NEGATIVE Final   C Diff interpretation No C. difficile detected.  Final    Comment: Performed at [facility], [address] [place], [place] [address]  CULTURE, BLOOD (ROUTINE X 2) w Reflex to ID Panel     Status: None (Preliminary result)   Collection Time: 12/17/18  5:02 PM  Result Value Ref Range Status   Specimen Description BLOOD PORTA CATH  Final   Special Requests   Final    BOTTLES DRAWN AEROBIC AND ANAEROBIC Blood Culture adequate volume   Culture   Final    NO GROWTH 4 DAYS Performed at [facility], [address], [place], [place] [address]    Report Status PENDING  Incomplete  CULTURE, BLOOD (ROUTINE X 2) w Reflex to ID Panel     Status: None (Preliminary result)   Collection Time: 12/18/18  3:30 AM  Result Value Ref Range Status   Specimen  Description BLOOD RIGHT ANTECUBITAL  Final   Special Requests   Final    BOTTLES DRAWN AEROBIC AND ANAEROBIC Blood Culture results may not be optimal due to an excessive volume of blood received in culture bottles   Culture   Final    NO GROWTH 3 DAYS Performed at [facility], [address] [place], [place] [address]    Report Status PENDING  Incomplete             ASSESSMENT AND PLAN SYNOPSIS   Severe Hypoxic and Hypercapnic Respiratory Failure from septic shock from enterofacelis species High risk for intubation Prognosis is poor Recommend DNR/DNI status   Renal Failure-ESRD on HD -follow chem 7 -follow UO -continue Foley Catheter-assess need daily Follow up nephrology recs   NEUROLOGY letharghic  Septic shock -use vasopressors to keep MAP>65 -follow ABG and LA -follow up cultures -emperic ABX -consider stress dose steroids -aggressive IV fluid resuscitation  CARDIAC ICU monitoring  ID -continue IV abx as prescibed -follow up cultures  GI/Nutrition GI PROPHYLAXIS as indicated DIET-->NPO Constipation protocol as indicated  ENDO - ICU hypoglycemic\Hyperglycemia protocol -check FSBS per protocol   ELECTROLYTES -follow labs as needed -replace as needed -pharmacy consultation and following   DVT/GI PRX ordered TRANSFUSIONS AS NEEDED MONITOR FSBS ASSESS the need for LABS as needed   Critical Care Time devoted to patient care services described in this note is 34 minutes.   Overall, patient is critically ill, prognosis is guarded.  Patient with Multiorgan failure and at high risk for cardiac arrest and death.    Corrin Parker, M.D.  [facility]  Medical Director [place] Director [facility] Cardio-Pulmonary Department
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
## 2018-12-21 NOTE — Progress Notes (Signed)
[facility]  ROUNDING NOTE   Subjective:   Lethargic, not following commands  Argatroban gtt. Platelets 49K  Norepinephrine gtt.   Objective:  Vital signs in last 24 hours:  Temp:  [97.3 F (36.3 C)-97.8 F (36.6 C)] 97.5 F (36.4 C) (03/23 0800) Pulse Rate:  [71-96] 86 (03/23 0800) Resp:  [8-24] 16 (03/23 0900) BP: (71-109)/(51-84) 79/54 (03/23 0900) SpO2:  [98 %-100 %] 100 % (03/23 0800)  Weight change:  Filed Weights   12/17/18 1100 12/17/18 1315 12/17/18 1420  Weight: (!) 180.3 kg (!) 180.3 kg (!) 180.3 kg    Intake/Output: I/O last 3 completed shifts: In: 1820.4 [I.V.:1260.4; Other:60; IV Piggyback:500] Out: 625 [Stool:625]   Intake/Output this shift:  Total I/O In: 242.7 [I.V.:204.5; IV Piggyback:38.2] Out: 100 [Stool:100]  Physical Exam: General: Critically ill appearing  Head: Normocephalic, atraumatic. Moist oral mucosal membranes  Eyes: Anicteric  Neck: Supple, trachea midline  Lungs:  Diminished bilaterally  Heart: regular  Abdomen:  Soft, nontender, obese  Extremities: 1+ peripheral edema.  Neurologic: Lethargic   Skin: No lesions  Access: R femoral temporary dialysis catheter 3/18 Dr. Lucky Cowboy    Basic Metabolic Panel: Recent Labs  Lab 12/17/18 1702 12/18/18 0331 12/18/18 2113 12/19/18 0751 12/19/18 0752 12/19/18 1933 12/20/18 0521 12/21/18 0447  NA 139 140 137 137  --   --  136 136  K 4.0 4.0 2.9* 3.0*  --   --  2.9* 3.4*  CL 97* 99 97* 97*  --   --  99 99  CO2 24 25 27 24   --   --  25 24  GLUCOSE 117* 102* 110* 117*  --   --  171* 127*  BUN 92* 97* 30* 41*  --   --  48* 54*  CREATININE 7.59* 8.31* 3.06* 4.69*  --   --  5.71* 6.46*  CALCIUM 8.5* 8.9 8.3* 8.6*  --   --  8.8* 8.6*  MG  --   --  1.8  --  1.8 1.8  --  2.0  PHOS 5.0*  --  2.1*  --  3.2 3.8  --  4.7*    Liver Function Tests: Recent Labs  Lab 12/26/2018 0439 12/18/18 2113  AST 15 22  ALT 10 14  ALKPHOS 77 88  BILITOT 0.9 0.7  PROT 6.2* 6.2*  ALBUMIN  2.1* 2.0*   No results for input(s): LIPASE, AMYLASE in the last 168 hours. No results for input(s): AMMONIA in the last 168 hours.  CBC: Recent Labs  Lab 12/15/18 0923  12/18/18 0331 12/18/18 2113 12/19/18 0751 12/20/18 0521 12/21/18 0447  WBC 5.8   < > 6.9 6.1 6.8 9.8 10.3  NEUTROABS 4.3  --  4.7  --  4.7 7.4 8.4*  HGB 7.8*   < > 8.2* 8.2* 7.9* 8.2* 8.0*  HCT 25.6*   < > 27.7* 27.6* 27.0* 28.1* 27.4*  MCV 89.5   < > 90.2 90.2 91.2 92.1 91.6  PLT 37*   < > 42* 38* 40* 47* 49*   < > = values in this interval not displayed.    Cardiac Enzymes: No results for input(s): CKTOTAL, CKMB, CKMBINDEX, TROPONINI in the last 168 hours.  BNP: Invalid input(s): POCBNP  CBG: Recent Labs  Lab 12/12/2018 1536 12/28/2018 1539 12/28/2018 1558 12/17/18 1411 12/21/18 0746  GLUCAP 40* 45* 148* 92 99    Microbiology: Results for orders placed or performed during the hospital encounter of 12/07/2018  CULTURE, BLOOD (ROUTINE X 2)  w Reflex to ID Panel     Status: Abnormal   Collection Time: 12/15/18  3:09 PM  Result Value Ref Range Status   Specimen Description   Final    BLOOD LEFT HAND Performed at [facility], [address] [place], [place] [address]    Special Requests   Final    AEROBIC BOTTLE ONLY Blood Culture results may not be optimal due to an inadequate volume of blood received in culture bottles Performed at [facility], [address], [place], [place] [address]    Culture  Setup Time   Final    GRAM POSITIVE COCCI AEROBIC BOTTLE ONLY CRITICAL VALUE NOTED.  VALUE IS CONSISTENT WITH PREVIOUSLY REPORTED AND CALLED VALUE. Performed at [facility], [address] [place], [place] [address]    Culture (A)  Final    ENTEROCOCCUS FAECALIS SUSCEPTIBILITIES PERFORMED ON PREVIOUS CULTURE WITHIN THE LAST 5 DAYS. Performed at [facility], [address], [place], [place] [address]    Report Status 12/18/2018 FINAL  Final  CULTURE, BLOOD  (ROUTINE X 2) w Reflex to ID Panel     Status: Abnormal   Collection Time: 12/15/18  3:16 PM  Result Value Ref Range Status   Specimen Description   Final    BLOOD RIGHT HAND Performed at [facility], [address], [place], [place] [address]    Special Requests   Final    BOTTLES DRAWN AEROBIC AND ANAEROBIC Blood Culture results may not be optimal due to an inadequate volume of blood received in culture bottles Performed at [facility], [address], [place], [place] [address]    Culture  Setup Time   Final    GRAM POSITIVE COCCI IN BOTH AEROBIC AND ANAEROBIC BOTTLES CRITICAL RESULT CALLED TO, READ BACK BY AND VERIFIED WITH: Vita Barley HALLHAI 12/18/2018 0448 REC Performed at [facility], [place] [address] [place],  [address]    Culture ENTEROCOCCUS FAECALIS (A)  Final   Report Status 12/18/2018 FINAL  Final   Organism ID, Bacteria ENTEROCOCCUS FAECALIS  Final      Susceptibility   Enterococcus faecalis - MIC*    AMPICILLIN <=2 SENSITIVE Sensitive     VANCOMYCIN 1 SENSITIVE Sensitive     GENTAMICIN SYNERGY SENSITIVE Sensitive     * ENTEROCOCCUS FAECALIS  Blood Culture ID Panel (Reflexed)     Status: Abnormal   Collection Time: 12/15/18  3:16 PM  Result Value Ref Range Status   Enterococcus species DETECTED (A) NOT DETECTED Final    Comment: CRITICAL RESULT CALLED TO, READ BACK BY AND VERIFIED WITH: SHEENA HALLHAI 12/12/2018 0448 REC    Vancomycin resistance NOT DETECTED NOT DETECTED Final   Listeria monocytogenes NOT DETECTED NOT DETECTED Final   Staphylococcus species NOT DETECTED NOT DETECTED Final   Staphylococcus aureus (BCID) NOT DETECTED NOT DETECTED Final   Streptococcus species NOT DETECTED NOT DETECTED Final   Streptococcus agalactiae NOT DETECTED NOT DETECTED Final   Streptococcus pneumoniae NOT DETECTED NOT DETECTED Final   Streptococcus pyogenes NOT DETECTED NOT DETECTED Final   Acinetobacter baumannii NOT DETECTED NOT DETECTED  Final   Enterobacteriaceae species NOT DETECTED NOT DETECTED Final   Enterobacter cloacae complex NOT DETECTED NOT DETECTED Final   Escherichia coli NOT DETECTED NOT DETECTED Final   Klebsiella oxytoca NOT DETECTED NOT DETECTED Final   Klebsiella pneumoniae NOT DETECTED NOT DETECTED Final   Proteus species NOT DETECTED NOT DETECTED Final   Serratia marcescens  NOT DETECTED NOT DETECTED Final   Haemophilus influenzae NOT DETECTED NOT DETECTED Final   Neisseria meningitidis NOT DETECTED NOT DETECTED Final   Pseudomonas aeruginosa NOT DETECTED NOT DETECTED Final   Candida albicans NOT DETECTED NOT DETECTED Final   Candida glabrata NOT DETECTED NOT DETECTED Final   Candida krusei NOT DETECTED NOT DETECTED Final   Candida parapsilosis NOT DETECTED NOT DETECTED Final   Candida tropicalis NOT DETECTED NOT DETECTED Final    Comment: Performed at [facility], [place], [place] [address]  Surgical PCR screen     Status: None   Collection Time: 12/04/2018  4:49 AM  Result Value Ref Range Status   MRSA, PCR NEGATIVE NEGATIVE Final   Staphylococcus aureus NEGATIVE NEGATIVE Final    Comment: (NOTE) The Xpert SA Assay (FDA approved for NASAL specimens in patients 34 years of age and older), is one component of a comprehensive surveillance program. It is not intended to diagnose infection nor to guide or monitor treatment. Performed at [facility], [address] [place], [place] [address]   Cath Tip Culture     Status: Abnormal   Collection Time: 12/08/2018  4:38 PM  Result Value Ref Range Status   Specimen Description   Final    CATH TIP Performed at [facility], [address] [place], [place] [address]    Special Requests   Final    NONE Performed at [facility], [address] [place], [place] [address]    Culture (A)  Final    ENTEROCOCCUS FAECALIS STAPHYLOCOCCUS EPIDERMIDIS    Report Status 12/20/2018 FINAL  Final    Organism ID, Bacteria ENTEROCOCCUS FAECALIS  Final   Organism ID, Bacteria STAPHYLOCOCCUS EPIDERMIDIS  Final      Susceptibility   Enterococcus faecalis - MIC*    AMPICILLIN <=2 SENSITIVE Sensitive     VANCOMYCIN 1 SENSITIVE Sensitive     GENTAMICIN SYNERGY SENSITIVE Sensitive     * ENTEROCOCCUS FAECALIS   Staphylococcus epidermidis - MIC*    CIPROFLOXACIN 4 RESISTANT Resistant     ERYTHROMYCIN >=8 RESISTANT Resistant     GENTAMICIN <=0.5 SENSITIVE Sensitive     OXACILLIN >=4 RESISTANT Resistant     TETRACYCLINE 2 SENSITIVE Sensitive     VANCOMYCIN 2 SENSITIVE Sensitive     TRIMETH/SULFA <=10 SENSITIVE Sensitive     CLINDAMYCIN <=0.25 SENSITIVE Sensitive     RIFAMPIN <=0.5 SENSITIVE Sensitive     Inducible Clindamycin NEGATIVE Sensitive     * STAPHYLOCOCCUS EPIDERMIDIS  C difficile quick scan w PCR reflex     Status: None   Collection Time: 12/17/18  6:25 AM  Result Value Ref Range Status   C Diff antigen NEGATIVE NEGATIVE Final   C Diff toxin NEGATIVE NEGATIVE Final   C Diff interpretation No C. difficile detected.  Final    Comment: Performed at [facility], [address] [place], [place] [address]  CULTURE, BLOOD (ROUTINE X 2) w Reflex to ID Panel     Status: None (Preliminary result)   Collection Time: 12/17/18  5:02 PM  Result Value Ref Range Status   Specimen Description BLOOD PORTA CATH  Final   Special Requests   Final    BOTTLES DRAWN AEROBIC AND ANAEROBIC Blood Culture adequate volume   Culture   Final    NO GROWTH 4 DAYS Performed at [facility], [address], [place], [place] [address]    Report Status PENDING  Incomplete  CULTURE, BLOOD (ROUTINE X 2) w Reflex to ID Panel     Status: None (Preliminary result)   Collection Time: 12/18/18  3:30 AM  Result Value Ref Range Status   Specimen Description BLOOD RIGHT ANTECUBITAL  Final   Special Requests   Final    BOTTLES DRAWN AEROBIC AND ANAEROBIC Blood Culture results may not be optimal  due to an excessive volume of blood received in culture bottles   Culture   Final    NO GROWTH 3 DAYS Performed at [facility], [address] [place], [place] [address]    Report Status PENDING  Incomplete    Coagulation Studies: No results for input(s): LABPROT, INR in the last 72 hours.  Urinalysis: No results for input(s): COLORURINE, LABSPEC, PHURINE, GLUCOSEU, HGBUR, BILIRUBINUR, KETONESUR, PROTEINUR, UROBILINOGEN, NITRITE, LEUKOCYTESUR in the last 72 hours.  Invalid input(s): APPERANCEUR    Imaging: No results found.   Medications:   . sodium chloride Stopped (12/20/18 1830)  . sodium chloride    . ampicillin (OMNIPEN) IV 2 g (12/21/18 0558)  . anticoagulant sodium citrate    . argatroban 0.2 mcg/kg/min (12/21/18 0800)  . cefTRIAXone (ROCEPHIN)  IV 2 g (12/21/18 0911)  . norepinephrine (LEVOPHED) Adult infusion 5 mcg/min (12/21/18 0923)   . buPROPion  300 mg Oral Daily  . Chlorhexidine Gluconate Cloth  6 each Topical Q0600  . cinacalcet  30 mg Oral Q breakfast  . epoetin (EPOGEN/PROCRIT) injection  10,000 Units Intravenous Q M,W,F-HD  . ferric citrate  420 mg Oral TID  . lactobacillus  1 g Oral TID WC  . levothyroxine  75 mcg Oral QAC breakfast  . mouth rinse  15 mL Mouth Rinse BID  . midodrine  5 mg Oral TID WC  . multivitamin  1 tablet Oral QHS  . pantoprazole (PROTONIX) IV  40 mg Intravenous QHS  . potassium chloride  40 mEq Oral Once  . sevelamer carbonate  1,600 mg Oral TID WC   sodium chloride, sodium chloride, acetaminophen **OR** acetaminophen, alteplase, anticoagulant sodium citrate, HYDROcodone-acetaminophen, HYDROmorphone (DILAUDID) injection, HYDROmorphone (DILAUDID) injection, iohexol, lidocaine (PF), lidocaine-prilocaine, meclizine, ondansetron (ZOFRAN) IV, ondansetron (ZOFRAN) IV, ondansetron **OR** [DISCONTINUED] ondansetron (ZOFRAN) IV, pentafluoroprop-tetrafluoroeth, polyethylene glycol  Assessment/ Plan:  48 y.o. female  Ms.  Lindsey Russell is a 48 y.o. black female with past medical history of ESRD on HD, morbid obesity, diabetes mellitus type 2, hypertension, anemia of chronic kidney disease, superior vena cava syndrome. admitted with non functional permcath and sepsis.  Enterococcus faecalis bacteremia with positive catheter tip culture.   [facility] MWF EDW 176.5  1.  ESRD on HD MWF.  Patient completed hemodialysis on Friday.  No urgent dictation for dialysis  Requiring vasopressors.  Not stable for intermittent hemodialysis at this time. Will discuss with family options of CRRT versus palliative.   2.  Anemia of chronic kidney disease - EPO with HD treatment  3. Thrombocytopenia: HIT negative on 3/20 - Argatroban gtt.   4. Sepsis/bacteremia: dialysis catheter infection.  Requiring vasopressors - Appreciate ID input.  - Ampicillin and ceftriaxone.    LOS: 7 Kijana Cromie 3/23/20209:49 AM

## 2018-12-21 NOTE — Progress Notes (Signed)
   Date of Admission:  12/27/2018    ID: Lindsey Russell is a 48 y.o. female Active Problems:   ESRD (end stage renal disease) on dialysis ([place])   Pressure injury of skin Enterococcus fecalis bacteremia  Subjective: Non available Lethargic On pressor  Medications:  . buPROPion  300 mg Oral Daily  . Chlorhexidine Gluconate Cloth  6 each Topical Q0600  . cinacalcet  30 mg Oral Q breakfast  . epoetin (EPOGEN/PROCRIT) injection  10,000 Units Intravenous Q M,W,F-HD  . ferric citrate  420 mg Oral TID  . insulin aspart  0-9 Units Subcutaneous Q4H  . lactobacillus  1 g Oral TID WC  . levothyroxine  75 mcg Oral QAC breakfast  . mouth rinse  15 mL Mouth Rinse BID  . midodrine  5 mg Oral TID WC  . multivitamin  1 tablet Oral QHS  . pantoprazole (PROTONIX) IV  40 mg Intravenous QHS  . sevelamer carbonate  1,600 mg Oral TID WC    Objective: Vital signs in last 24 hours: Temp:  [97.3 F (36.3 C)-97.8 F (36.6 C)] 97.5 F (36.4 C) (03/23 0800) Pulse Rate:  [77-96] 82 (03/23 1000) Resp:  [8-24] 14 (03/23 1200) BP: (71-104)/(47-84) 91/59 (03/23 1200) SpO2:  [98 %-100 %] 100 % (03/23 1200)  PHYSICAL EXAM: Morbid obesity Rt femoral line for dialysis B/l air entry  Lab Results Recent Labs    12/20/18 0521 12/21/18 0447  WBC 9.8 10.3  HGB 8.2* 8.0*  HCT 28.1* 27.4*  NA 136 136  K 2.9* 3.4*  CL 99 99  CO2 25 24  BUN 48* 54*  CREATININE 5.71* 6.46*   Liver Panel Recent Labs    12/18/18 2113  PROT 6.2*  ALBUMIN 2.0*  AST 22  ALT 14  ALKPHOS 88  BILITOT 0.7   Sedimentation Rate No results for input(s): ESRSEDRATE in the last 72 hours. C-Reactive Protein No results for input(s): CRP in the last 72 hours.  Microbiology:  Studies/Results: No results found.   Assessment/Plan: Enterococcus bacteremia-on ampicillin and ceftriaxone- being treated like endocarditis- TEE needed to r/o endocarditis  Also has been c/o rt hip pain for the past month- will need some kind  of imaging MRI/CT but her weight precludes those tests  ESRD poor access- has femoral line for dialysis  Encephalopathy- metabolic  Hypotension-on low dose pressor May need A line to measure BP accurately Discussed with her nurse

## 2018-12-21 NOTE — Progress Notes (Addendum)
ANTICOAGULATION CONSULT NOTE  Pharmacy Consult for Argatroban Drip Management  Indication: Possible HIT  Patient Measurements: Height: 5\' 4"  (162.6 cm) Weight: (!) 397 lb 7.8 oz (180.3 kg) IBW/kg (Calculated) : 54.7  Vital Signs: Temp: 97.3 F (36.3 C) (03/22 2000) Temp Source: Axillary (03/22 2000) BP: 87/63 (03/22 2200) Pulse Rate: 82 (03/22 2200)  Labs: Recent Labs    12/18/18 2113 12/19/18 0751  12/20/18 0521  12/20/18 1314 12/20/18 1852 12/21/18 0045  HGB 8.2* 7.9*  --  8.2*  --   --   --   --   HCT 27.6* 27.0*  --  28.1*  --   --   --   --   PLT 38* 40*  --  47*  --   --   --   --   APTT  --  88*   < > 102*   < > 93* 82* 96*  CREATININE 3.06* 4.69*  --  5.71*  --   --   --   --    < > = values in this interval not displayed.    Estimated Creatinine Clearance: 19.7 mL/min (A) (by C-G formula based on SCr of 5.71 mg/dL (H)).   Medical History: Past Medical History:  Diagnosis Date  . Anemia of chronic disease   . Chest pain    a. 11/2006 Cath: minimal nonobs dzs; b. 08/2018 MV: EF 64%, no ischemia.  . Chronic hypotension   . Chronic right hip pain   . Depression   . Diastolic dysfunction    a. 06/2016 Echo: EF 55-60%, Gr1 DD; b. 03/2017 Echo: EF 55-60%, LVH. Mild TR; c. 11/2018 Echo: EF 60-65%, mild conc LVH. Mildly dil RA. Triv AI.  [name] ESRD (end stage renal disease) ([place])    a. Long h/o graft access difficulties.  . Hyperkalemia   . Hyperlipidemia   . Hypothyroidism   . Sleep apnea   . Super obesity   . Type II diabetes mellitus (H[facility])     Medications:  Scheduled:  . buPROPion  300 mg Oral Daily  . Chlorhexidine Gluconate Cloth  6 each Topical Q0600  . cinacalcet  30 mg Oral Q breakfast  . epoetin (EPOGEN/PROCRIT) injection  10,000 Units Intravenous Q M,W,F-HD  . ferric citrate  420 mg Oral TID  . lactobacillus  1 g Oral TID WC  . levothyroxine  75 mcg Oral QAC breakfast  . midodrine  5 mg Oral TID WC  . multivitamin  1 tablet Oral QHS  .  pantoprazole (PROTONIX) IV  40 mg Intravenous QHS  . potassium chloride  40 mEq Oral Once  . sevelamer carbonate  1,600 mg Oral TID WC   Infusions:  . sodium chloride Stopped (12/20/18 1830)  . sodium chloride    . ampicillin (OMNIPEN) IV Stopped (12/20/18 1850)  . anticoagulant sodium citrate    . argatroban 0.3 mcg/kg/min (12/20/18 2200)  . cefTRIAXone (ROCEPHIN)  IV 2 g (12/20/18 2253)  . norepinephrine (LEVOPHED) Adult infusion 4 mcg/min (12/20/18 2200)    Assessment: Pharmacy consulted for argatroban drip for 48 yo ICU patient requiring a norepinephrine infusion and received dialysis. Patient received dialysis on 3/20. Patient is being evaluated for possible HIT.  Patient initiated on argatroban 0.73mcg/kg/min. First two aPTTs are in therapeutic range. Will obtain aPTT with am labs. Will plan to obtain aPTT Q12hr while patient is in therapeutic range and in ICU. Original infusion rate 0.5 mcg/kg/min  Goal of Therapy:  aPTT 50-90 seconds Monitor platelets  by anticoagulation protocol: Yes   Course of Therapy 03/20 1518 aPTT 88s 03/20 1839 aPTT 84s 03/21 0751 aPTT 88s 03/21 1933 aPTT 97s 03/22 0521 aPTT 102s: decreased to 0.35 mcg/kg/min 03/22 0929 aPTT 77s 03/22 1314 aPTT 93s Decreased to 0.30 mcg/kg/min 03/22 1852 aPTT 82 s 03/23  0045 aPTT 96s   Plan:  3/23 aPTT supratherapeutic. Will decrease infusion to 0.20 mcg/kg/min  --recheck aPTT level in 2 hours following rate change  Pharmacy will continue to monitor and adjust per consult.   Pernell Dupre, PharmD, BCPS Clinical Pharmacist 12/21/2018 1:15 AM

## 2018-12-21 NOTE — Progress Notes (Signed)
Patient ID: Lindsey Russell, female   DOB: July 02, 1971, 48 y.o.   MRN: [id number]  This NP visited patient at the bedside as a follow up for palliative medicine needs and emotional support.  Patient is obtunded and minimally responsive.  Discussed current medical situation with Dr. Laurence Ferrari.  Patient has a poor prognosis.  Placed call and spoke to patient's husband by telephone for continued conversation regarding diagnosis, prognosis, goals of care, end-of-life wishes and options.  Detailed discussion had regarding the difference between an aggressive medical intervention path and a palliative comfort path for this patient at this time in this situation.  Husband verbalizes an understanding of the seriousness of the situation and the high risk to decompensate however at this time he cannot make any decision regarding de-escalation of care and allowing a natural death.  He plans to continue this conversation with his stepdaughter.  This is especially difficult in that the family live hours away from the hospital.  Additional support offered.  Discussed the natural trajectory and expectations at end of life.  Discussed human mortality and the concept of limitations of medical interventions to prolong quality of life when the body begins to fail to thrive   Discussed with husband  the importance of continued conversation with his family and the medical providers regarding overall plan of care and treatment options,  ensuring decisions are within the context of the patients values and GOCs.  Questions and concerns addressed   Discussed with Dr Mortimer Fries  Total time spent on the unit was 35 minutes  Greater than 50% of the time was spent in counseling and coordination of care  Wadie Lessen NP  Palliative Medicine Team Team Phone # [phone] Pager [phone]

## 2018-12-22 DIAGNOSIS — Z515 Encounter for palliative care: Secondary | ICD-10-CM

## 2018-12-22 DIAGNOSIS — R627 Adult failure to thrive: Secondary | ICD-10-CM

## 2018-12-22 DIAGNOSIS — Z7189 Other specified counseling: Secondary | ICD-10-CM

## 2018-12-22 LAB — CBC WITH DIFFERENTIAL/PLATELET
Abs Immature Granulocytes: 0.5 10*3/uL — ABNORMAL HIGH (ref 0.00–0.07)
Basophils Absolute: 0 10*3/uL (ref 0.0–0.1)
Basophils Relative: 0 %
Eosinophils Absolute: 0.1 10*3/uL (ref 0.0–0.5)
Eosinophils Relative: 1 %
HCT: 25.9 % — ABNORMAL LOW (ref 36.0–46.0)
Hemoglobin: 7.6 g/dL — ABNORMAL LOW (ref 12.0–15.0)
Immature Granulocytes: 4 %
Lymphocytes Relative: 10 %
Lymphs Abs: 1.2 10*3/uL (ref 0.7–4.0)
MCH: 27 pg (ref 26.0–34.0)
MCHC: 29.3 g/dL — ABNORMAL LOW (ref 30.0–36.0)
MCV: 91.8 fL (ref 80.0–100.0)
Monocytes Absolute: 0.3 10*3/uL (ref 0.1–1.0)
Monocytes Relative: 2 %
Neutro Abs: 9.9 10*3/uL — ABNORMAL HIGH (ref 1.7–7.7)
Neutrophils Relative %: 83 %
Platelets: 48 10*3/uL — ABNORMAL LOW (ref 150–400)
RBC: 2.82 MIL/uL — ABNORMAL LOW (ref 3.87–5.11)
RDW: 17.5 % — ABNORMAL HIGH (ref 11.5–15.5)
WBC: 11.9 10*3/uL — ABNORMAL HIGH (ref 4.0–10.5)
nRBC: 1.1 % — ABNORMAL HIGH (ref 0.0–0.2)

## 2018-12-22 LAB — RENAL FUNCTION PANEL
Albumin: 1.7 g/dL — ABNORMAL LOW (ref 3.5–5.0)
Albumin: 1.7 g/dL — ABNORMAL LOW (ref 3.5–5.0)
Albumin: 1.8 g/dL — ABNORMAL LOW (ref 3.5–5.0)
Albumin: 1.7 g/dL — ABNORMAL LOW (ref 3.5–5.0)
Anion gap: 14 (ref 5–15)
Anion gap: 14 (ref 5–15)
Anion gap: 19 — ABNORMAL HIGH (ref 5–15)
Anion gap: 19 — ABNORMAL HIGH (ref 5–15)
BUN: 59 mg/dL — AB (ref 6–20)
BUN: 52 mg/dL — ABNORMAL HIGH (ref 6–20)
BUN: 49 mg/dL — ABNORMAL HIGH (ref 6–20)
BUN: 38 mg/dL — ABNORMAL HIGH (ref 6–20)
CHLORIDE: 98 mmol/L (ref 98–111)
CO2: 22 mmol/L (ref 22–32)
CO2: 21 mmol/L — AB (ref 22–32)
CO2: 16 mmol/L — AB (ref 22–32)
CO2: 16 mmol/L — ABNORMAL LOW (ref 22–32)
Calcium: 8.3 mg/dL — ABNORMAL LOW (ref 8.9–10.3)
Calcium: 8.3 mg/dL — ABNORMAL LOW (ref 8.9–10.3)
Calcium: 8.3 mg/dL — ABNORMAL LOW (ref 8.9–10.3)
Calcium: 8.1 mg/dL — ABNORMAL LOW (ref 8.9–10.3)
Chloride: 97 mmol/L — ABNORMAL LOW (ref 98–111)
Chloride: 100 mmol/L (ref 98–111)
Chloride: 100 mmol/L (ref 98–111)
Creatinine, Ser: 7.1 mg/dL — ABNORMAL HIGH (ref 0.44–1.00)
Creatinine, Ser: 5.88 mg/dL — ABNORMAL HIGH (ref 0.44–1.00)
Creatinine, Ser: 5.54 mg/dL — ABNORMAL HIGH (ref 0.44–1.00)
Creatinine, Ser: 4.22 mg/dL — ABNORMAL HIGH (ref 0.44–1.00)
GFR calc Af Amer: 7 mL/min — ABNORMAL LOW (ref >60)
GFR calc Af Amer: 9 mL/min — ABNORMAL LOW (ref >60)
GFR calc Af Amer: 10 mL/min — ABNORMAL LOW (ref >60)
GFR calc non Af Amer: 6 mL/min — ABNORMAL LOW (ref >60)
GFR calc non Af Amer: 8 mL/min — ABNORMAL LOW (ref >60)
GFR calc non Af Amer: 8 mL/min — ABNORMAL LOW (ref >60)
GFR calc non Af Amer: 12 mL/min — ABNORMAL LOW (ref >60)
GFR, EST AFRICAN AMERICAN: 13 mL/min — AB (ref >60)
GLUCOSE: 140 mg/dL — AB (ref 70–99)
Glucose, Bld: 153 mg/dL — ABNORMAL HIGH (ref 70–99)
Glucose, Bld: 144 mg/dL — ABNORMAL HIGH (ref 70–99)
Glucose, Bld: 152 mg/dL — ABNORMAL HIGH (ref 70–99)
POTASSIUM: 4.3 mmol/L (ref 3.5–5.1)
Phosphorus: 5.5 mg/dL — ABNORMAL HIGH (ref 2.5–4.6)
Phosphorus: 4.5 mg/dL (ref 2.5–4.6)
Phosphorus: 4.9 mg/dL — ABNORMAL HIGH (ref 2.5–4.6)
Phosphorus: 4.2 mg/dL (ref 2.5–4.6)
Potassium: 3.3 mmol/L — ABNORMAL LOW (ref 3.5–5.1)
Potassium: 3.8 mmol/L (ref 3.5–5.1)
Potassium: 4 mmol/L (ref 3.5–5.1)
Sodium: 133 mmol/L — ABNORMAL LOW (ref 135–145)
Sodium: 135 mmol/L (ref 135–145)
Sodium: 133 mmol/L — ABNORMAL LOW (ref 135–145)
Sodium: 135 mmol/L (ref 135–145)

## 2018-12-22 LAB — COMPREHENSIVE METABOLIC PANEL
ALT: 17 U/L (ref 0–44)
AST: 20 U/L (ref 15–41)
Albumin: 1.7 g/dL — ABNORMAL LOW (ref 3.5–5.0)
Alkaline Phosphatase: 77 U/L (ref 38–126)
Anion gap: 15 (ref 5–15)
BUN: 60 mg/dL — ABNORMAL HIGH (ref 6–20)
CALCIUM: 8.2 mg/dL — AB (ref 8.9–10.3)
CO2: 20 mmol/L — ABNORMAL LOW (ref 22–32)
Chloride: 101 mmol/L (ref 98–111)
Creatinine, Ser: 6.99 mg/dL — ABNORMAL HIGH (ref 0.44–1.00)
GFR calc non Af Amer: 6 mL/min — ABNORMAL LOW (ref >60)
GFR, EST AFRICAN AMERICAN: 7 mL/min — AB (ref >60)
Glucose, Bld: 141 mg/dL — ABNORMAL HIGH (ref 70–99)
Potassium: 3.8 mmol/L (ref 3.5–5.1)
SODIUM: 136 mmol/L (ref 135–145)
Total Bilirubin: 0.6 mg/dL (ref 0.3–1.2)
Total Protein: 5.2 g/dL — ABNORMAL LOW (ref 6.5–8.1)

## 2018-12-22 LAB — SEROTONIN RELEASE ASSAY (SRA)
SRA .2 IU/mL UFH Ser-aCnc: 1 % (ref 0–20)
SRA, HIGH DOSE HEPARIN: 1 % (ref 0–20)

## 2018-12-22 LAB — CULTURE, BLOOD (ROUTINE X 2)
Culture: NO GROWTH
Special Requests: ADEQUATE

## 2018-12-22 LAB — GLUCOSE, CAPILLARY
Glucose-Capillary: 118 mg/dL — ABNORMAL HIGH (ref 70–99)
Glucose-Capillary: 118 mg/dL — ABNORMAL HIGH (ref 70–99)
Glucose-Capillary: 126 mg/dL — ABNORMAL HIGH (ref 70–99)
Glucose-Capillary: 130 mg/dL — ABNORMAL HIGH (ref 70–99)
Glucose-Capillary: 145 mg/dL — ABNORMAL HIGH (ref 70–99)
Glucose-Capillary: 96 mg/dL (ref 70–99)

## 2018-12-22 LAB — HEMOGLOBIN AND HEMATOCRIT, BLOOD
HCT: 26.7 % — ABNORMAL LOW (ref 36.0–46.0)
Hemoglobin: 7.6 g/dL — ABNORMAL LOW (ref 12.0–15.0)

## 2018-12-22 LAB — MAGNESIUM
Magnesium: 1.9 mg/dL (ref 1.7–2.4)
Magnesium: 2 mg/dL (ref 1.7–2.4)
Magnesium: 1.8 mg/dL (ref 1.7–2.4)

## 2018-12-22 MED ORDER — EPOETIN ALFA 10000 UNIT/ML IJ SOLN: 10000.0000 [IU] | INTRAMUSCULAR | Status: DC

## 2018-12-22 MED ORDER — VASOPRESSIN 20 UNIT/ML IV SOLN
0.0300 [IU]/min | INTRAVENOUS | Status: DC
  Administered 2018-12-22: 0.03 [IU]/min via INTRAVENOUS
  Filled 2018-12-22: qty 2

## 2018-12-22 MED ORDER — SODIUM CHLORIDE 0.9 % IV SOLN
2.0000 g | Freq: Three times a day (TID) | INTRAVENOUS | Status: DC
  Administered 2018-12-22 – 2018-12-23 (×2): 2 g via INTRAVENOUS
  Filled 2018-12-22: qty 2000
  Filled 2018-12-22: qty 2
  Filled 2018-12-22: qty 2000
  Filled 2018-12-22: qty 2

## 2018-12-22 MED ORDER — ALBUMIN HUMAN 25 % IV SOLN
25.0000 g | Freq: Once | INTRAVENOUS | Status: AC
  Administered 2018-12-22: 25 g via INTRAVENOUS
  Filled 2018-12-22: qty 100

## 2018-12-22 MED ORDER — HEPARIN SODIUM (PORCINE) 1000 UNIT/ML DIALYSIS
1000.0000 [IU] | INTRAMUSCULAR | Status: DC | PRN
  Filled 2018-12-22: qty 6

## 2018-12-22 MED ORDER — NOREPINEPHRINE 16 MG/250ML-% IV SOLN
0.0000 ug/min | INTRAVENOUS | Status: DC
  Administered 2018-12-22: 40 ug/min via INTRAVENOUS
  Filled 2018-12-22 (×2): qty 250

## 2018-12-22 MED ORDER — AMIODARONE LOAD VIA INFUSION
150.0000 mg | Freq: Once | INTRAVENOUS | Status: DC
  Filled 2018-12-22: qty 83.34

## 2018-12-22 MED ORDER — PUREFLOW DIALYSIS SOLUTION
INTRAVENOUS | Status: DC
  Administered 2018-12-22: 14:00:00 via INTRAVENOUS_CENTRAL

## 2018-12-22 MED ORDER — HYDROCORTISONE NA SUCCINATE PF 100 MG IJ SOLR
50.0000 mg | Freq: Four times a day (QID) | INTRAMUSCULAR | Status: DC
  Administered 2018-12-22 – 2018-12-23 (×4): 50 mg via INTRAVENOUS
  Filled 2018-12-22 (×4): qty 2

## 2018-12-22 MED ORDER — SODIUM CHLORIDE 0.9 % IV SOLN
0.0000 ug/min | INTRAVENOUS | Status: DC
  Administered 2018-12-23: 200 ug/min via INTRAVENOUS
  Filled 2018-12-22: qty 1

## 2018-12-22 MED ORDER — AMIODARONE IV BOLUS ONLY 150 MG/100ML
150.0000 mg | Freq: Once | INTRAVENOUS | Status: AC
  Administered 2018-12-22: 150 mg via INTRAVENOUS

## 2018-12-22 NOTE — Progress Notes (Signed)
CRITICAL CARE NOTE  CC  Follow up encephalopathy follow up septic shock  SUBJECTIVE Remains critically ill Prognosis is poor Lethargic on vasopressors ESRD on HD  +sacral ulcers +moaoning in pain Family updated over the phone   SIGNIFICANT EVENTS 3.23 remains on pressors 3.24 remains on pressors-added stress dose steroids  REVIEW OF SYSTEMS  PATIENT IS UNABLE TO PROVIDE COMPLETE REVIEW OF SYSTEMS DUE TO SEVERE CRITICAL ILLNESS   PHYSICAL EXAMINATION:  GENERAL:critically ill appearing,  HEAD: Normocephalic, atraumatic.  EYES: Pupils equal, round, reactive to light.  No scleral icterus.  MOUTH: Moist mucosal membrane. NECK: Supple. No thyromegaly. No nodules. No JVD.  PULMONARY: +rhonchi,  CARDIOVASCULAR: S1 and S2. Regular rate and rhythm. No murmurs, rubs, or gallops.  GASTROINTESTINAL: Soft, nontender, -distended. No masses. Positive bowel sounds. No hepatosplenomegaly.  MUSCULOSKELETAL: +edema.  NEUROLOGIC: obtunded SKIN:intact,warm,dry      CULTURE RESULTS   Recent Results (from the past 240 hour(s))  CULTURE, BLOOD (ROUTINE X 2) w Reflex to ID Panel     Status: Abnormal   Collection Time: 12/15/18  3:09 PM  Result Value Ref Range Status   Specimen Description   Final    BLOOD LEFT HAND Performed at [facility], [address] [place], [place] [address]    Special Requests   Final    AEROBIC BOTTLE ONLY Blood Culture results may not be optimal due to an inadequate volume of blood received in culture bottles Performed at [facility], [address] [place], [place] [address]    Culture  Setup Time   Final    GRAM POSITIVE COCCI AEROBIC BOTTLE ONLY CRITICAL VALUE NOTED.  VALUE IS CONSISTENT WITH PREVIOUSLY REPORTED AND CALLED VALUE. Performed at [facility], [address], [place], [place] [address]    Culture (A)  Final    ENTEROCOCCUS FAECALIS SUSCEPTIBILITIES PERFORMED ON PREVIOUS CULTURE WITHIN THE LAST 5  DAYS. Performed at [facility], [place] [address], [place], [place] [address]    Report Status 12/18/2018 FINAL  Final  CULTURE, BLOOD (ROUTINE X 2) w Reflex to ID Panel     Status: Abnormal   Collection Time: 12/15/18  3:16 PM  Result Value Ref Range Status   Specimen Description   Final    BLOOD RIGHT HAND Performed at [facility], [address], [place], [place] [address]    Special Requests   Final    BOTTLES DRAWN AEROBIC AND ANAEROBIC Blood Culture results may not be optimal due to an inadequate volume of blood received in culture bottles Performed at [facility], [address], [place], [place] [address]    Culture  Setup Time   Final    GRAM POSITIVE COCCI IN BOTH AEROBIC AND ANAEROBIC BOTTLES CRITICAL RESULT CALLED TO, READ BACK BY AND VERIFIED WITH: Vita Barley HALLHAI 12/06/2018 0448 REC Performed at [facility], [place] [address] [place],  [address]    Culture ENTEROCOCCUS FAECALIS (A)  Final   Report Status 12/18/2018 FINAL  Final   Organism ID, Bacteria ENTEROCOCCUS FAECALIS  Final      Susceptibility   Enterococcus faecalis - MIC*    AMPICILLIN <=2 SENSITIVE Sensitive     VANCOMYCIN 1 SENSITIVE Sensitive     GENTAMICIN SYNERGY SENSITIVE Sensitive     * ENTEROCOCCUS FAECALIS  Blood Culture ID Panel (Reflexed)     Status: Abnormal   Collection Time: 12/15/18  3:16 PM  Result Value Ref Range Status   Enterococcus species  DETECTED (A) NOT DETECTED Final    Comment: CRITICAL RESULT CALLED TO, READ BACK BY AND VERIFIED WITH: SHEENA HALLHAI 12/19/2018 0448 REC    Vancomycin resistance NOT DETECTED NOT DETECTED Final   Listeria monocytogenes NOT DETECTED NOT DETECTED Final   Staphylococcus species NOT DETECTED NOT DETECTED Final   Staphylococcus aureus (BCID) NOT DETECTED NOT DETECTED Final   Streptococcus species NOT DETECTED NOT DETECTED Final   Streptococcus agalactiae NOT DETECTED NOT DETECTED Final   Streptococcus pneumoniae NOT  DETECTED NOT DETECTED Final   Streptococcus pyogenes NOT DETECTED NOT DETECTED Final   Acinetobacter baumannii NOT DETECTED NOT DETECTED Final   Enterobacteriaceae species NOT DETECTED NOT DETECTED Final   Enterobacter cloacae complex NOT DETECTED NOT DETECTED Final   Escherichia coli NOT DETECTED NOT DETECTED Final   Klebsiella oxytoca NOT DETECTED NOT DETECTED Final   Klebsiella pneumoniae NOT DETECTED NOT DETECTED Final   Proteus species NOT DETECTED NOT DETECTED Final   Serratia marcescens NOT DETECTED NOT DETECTED Final   Haemophilus influenzae NOT DETECTED NOT DETECTED Final   Neisseria meningitidis NOT DETECTED NOT DETECTED Final   Pseudomonas aeruginosa NOT DETECTED NOT DETECTED Final   Candida albicans NOT DETECTED NOT DETECTED Final   Candida glabrata NOT DETECTED NOT DETECTED Final   Candida krusei NOT DETECTED NOT DETECTED Final   Candida parapsilosis NOT DETECTED NOT DETECTED Final   Candida tropicalis NOT DETECTED NOT DETECTED Final    Comment: Performed at [facility], [place], [place] [address]  Surgical PCR screen     Status: None   Collection Time: 12/14/2018  4:49 AM  Result Value Ref Range Status   MRSA, PCR NEGATIVE NEGATIVE Final   Staphylococcus aureus NEGATIVE NEGATIVE Final    Comment: (NOTE) The Xpert SA Assay (FDA approved for NASAL specimens in patients 12 years of age and older), is one component of a comprehensive surveillance program. It is not intended to diagnose infection nor to guide or monitor treatment. Performed at [facility], [address] [place], [place] [address]   Cath Tip Culture     Status: Abnormal   Collection Time: 12/29/2018  4:38 PM  Result Value Ref Range Status   Specimen Description   Final    CATH TIP Performed at [facility], [address] [place], [place] [address]    Special Requests   Final    NONE Performed at [facility], [address] [place],   [address]    Culture (A)  Final    ENTEROCOCCUS FAECALIS STAPHYLOCOCCUS EPIDERMIDIS    Report Status 12/20/2018 FINAL  Final   Organism ID, Bacteria ENTEROCOCCUS FAECALIS  Final   Organism ID, Bacteria STAPHYLOCOCCUS EPIDERMIDIS  Final      Susceptibility   Enterococcus faecalis - MIC*    AMPICILLIN <=2 SENSITIVE Sensitive     VANCOMYCIN 1 SENSITIVE Sensitive     GENTAMICIN SYNERGY SENSITIVE Sensitive     * ENTEROCOCCUS FAECALIS   Staphylococcus epidermidis - MIC*    CIPROFLOXACIN 4 RESISTANT Resistant     ERYTHROMYCIN >=8 RESISTANT Resistant     GENTAMICIN <=0.5 SENSITIVE Sensitive     OXACILLIN >=4 RESISTANT Resistant     TETRACYCLINE 2 SENSITIVE Sensitive     VANCOMYCIN 2 SENSITIVE Sensitive     TRIMETH/SULFA <=10 SENSITIVE Sensitive     CLINDAMYCIN <=0.25 SENSITIVE Sensitive     RIFAMPIN <=0.5 SENSITIVE Sensitive     Inducible Clindamycin NEGATIVE Sensitive     *  STAPHYLOCOCCUS EPIDERMIDIS  C difficile quick scan w PCR reflex     Status: None   Collection Time: 12/17/18  6:25 AM  Result Value Ref Range Status   C Diff antigen NEGATIVE NEGATIVE Final   C Diff toxin NEGATIVE NEGATIVE Final   C Diff interpretation No C. difficile detected.  Final    Comment: Performed at [facility], [address] [place], [place] [address]  CULTURE, BLOOD (ROUTINE X 2) w Reflex to ID Panel     Status: None   Collection Time: 12/17/18  5:02 PM  Result Value Ref Range Status   Specimen Description BLOOD PORTA CATH  Final   Special Requests   Final    BOTTLES DRAWN AEROBIC AND ANAEROBIC Blood Culture adequate volume   Culture   Final    NO GROWTH 5 DAYS Performed at [facility], [address] [place], [place] [address]    Report Status 12/22/2018 FINAL  Final  CULTURE, BLOOD (ROUTINE X 2) w Reflex to ID Panel     Status: None (Preliminary result)   Collection Time: 12/18/18  3:30 AM  Result Value Ref Range Status   Specimen Description BLOOD RIGHT ANTECUBITAL   Final   Special Requests   Final    BOTTLES DRAWN AEROBIC AND ANAEROBIC Blood Culture results may not be optimal due to an excessive volume of blood received in culture bottles   Culture   Final    NO GROWTH 4 DAYS Performed at [facility], [address], [place], [place] [address]    Report Status PENDING  Incomplete             ASSESSMENT AND PLAN SYNOPSIS  Severe Hypoxic and Hypercapnic Respiratory Failure from septic shock ENTERO FAECALIS SPECIES bacteremia Prognosis is poor Recommend DNR/DNI High risk for intubation  Renal failure ESRD on HD Had hard time with clotting Follow up nephrology recs May need CRRT  Neurology lethargic from encephalopathy and acidosis  Septic shock -use vasopressors to keep MAP>65 -follow ABG and LA -follow up cultures -emperic ABX -consider stress dose steroids -aggressive IV fluid Resuscitation    CARDIAC ICU monitoring  INFECTIOUS DISEASE -continue antibiotics as prescribed -follow up cultures -follow up ID consultation    GI/Nutrition GI PROPHYLAXIS as indicated DIET-->NPO  ENDO - ICU hypoglycemic\Hyperglycemia protocol -check FSBS per protocol  ELECTROLYTES -follow labs as needed -replace as needed -pharmacy consultation and following    DVT/GI PRX ordered TRANSFUSIONS AS NEEDED MONITOR FSBS ASSESS the need for LABS as needed    Critical Care Time devoted to patient care services described in this note is 32 minutes.   Overall, patient is critically ill, prognosis is guarded.  Patient with Multiorgan failure and at high risk for cardiac arrest and death.    Corrin Parker, M.D.  [facility]  Medical Director [place] Director [facility] Cardio-Pulmonary Department

## 2018-12-22 NOTE — Progress Notes (Signed)
Patient ID: Lindsey Russell, female   DOB: 1970-10-23, 48 y.o.   MRN: [id number]  This NP visited patient at the bedside as a follow up for palliative medicine needs and emotional support.  Patient is obtunded and minimally responsive, bedside RN initiation CRRT.    Discussed current medical situation with Dr. Laurence Ferrari.  Patient has a poor prognosis.  Placed call and spoke to patient's husband by telephone for continued conversation regarding diagnosis, prognosis, goals of care, end-of-life wishes and options.  Again discussed the difference between an aggressive medical intervention path and a palliative comfort path for this patient at this time in this situation.  Husband verbalizes an understanding of the seriousness of the situation and the high risk to decompensate however at this time he cannot make any decision regarding de-escalation of care and allowing a natural death.   This is especially difficult in that the family live hours away from the hospital.  Discussed with husband  the importance of continued conversation with his family and the medical providers regarding overall plan of care and treatment options,  ensuring decisions are within the context of the patients values and GOCs.  Questions and concerns addressed     Discussed with bedside RN this nurse practitioner will continue to speak with family by telephone offering education, and support in navigating the difficult medical decisions they are facing for [name]  Total time spent on the unit was 15 minutes  Greater than 50% of the time was spent in counseling and coordination of care  Wadie Lessen NP  Palliative Medicine Team Team Phone # [phone] Pager [phone]

## 2018-12-22 NOTE — Progress Notes (Signed)
   Date of Admission:  12/01/2018     Enterococcus bacteremia ESRD with vascular access issue Rt femoral dialysis cath   Subjective: obtunded  Medications:  . buPROPion  300 mg Oral Daily  . Chlorhexidine Gluconate Cloth  6 each Topical Q0600  . [START ON 2019/01/05] epoetin (EPOGEN/PROCRIT) injection  10,000 Units Subcutaneous Q M,W,F-HD  . hydrocortisone sod succinate (SOLU-CORTEF) inj  50 mg Intravenous Q6H  . insulin aspart  0-9 Units Subcutaneous Q4H  . lactobacillus  1 g Oral TID WC  . levothyroxine  75 mcg Oral QAC breakfast  . mouth rinse  15 mL Mouth Rinse BID  . metroNIDAZOLE  1 Applicatorful Vaginal QHS  . multivitamin  1 tablet Oral QHS  . pantoprazole (PROTONIX) IV  40 mg Intravenous QHS  . sevelamer carbonate  1,600 mg Oral TID WC    Objective: Vital signs in last 24 hours: Temp:  [97.1 F (36.2 C)-98.6 F (37 C)] 97.1 F (36.2 C) (03/24 1100) Pulse Rate:  [87-95] 87 (03/24 1500) Resp:  [7-23] 19 (03/24 1630) BP: (68-111)/(43-82) 79/67 (03/24 1630) SpO2:  [93 %-100 %] 99 % (03/24 1500) Weight:  [179 kg] 179 kg (03/24 0500)  PHYSICAL EXAM:  General:obtunded Morbid obesity Lungs: b/l air entry Heart: s1s2 Abdomen: Soft, non-tender,not distended.  Extremities: atraumatic, no cyanosis. No edema. No clubbing Skin: maceration buttocks ( from pictures) Neurologic: obtunded Lab Results Recent Labs    12/21/18 0447 12/22/18 0440 12/22/18 1241 12/22/18 1503  WBC 10.3 11.9*  --   --   HGB 8.0* 7.6*  --   --   HCT 27.4* 25.9*  --   --   NA 136 136 133* 135  K 3.4* 3.8 3.3* 3.8  CL 99 101 97* 100  CO2 24 20* 22 21*  BUN 54* 60* 59* 52*  CREATININE 6.46* 6.99* 7.10* 5.88*   Liver Panel Recent Labs    12/22/18 0440 12/22/18 1241 12/22/18 1503  PROT 5.2*  --   --   ALBUMIN 1.7* 1.7* 1.7*  AST 20  --   --   ALT 17  --   --   ALKPHOS 77  --   --   BILITOT 0.6  --   --    Sedimentation Rate No results for input(s): ESRSEDRATE in the last 72  hours. C-Reactive Protein No results for input(s): CRP in the last 72 hours.  Microbiology:  Studies/Results: No results found.   Assessment/Plan: Enterococcus bacteremia-on ampicillin and ceftriaxone- being treated like endocarditis- TEE neededto r/o endocarditis. If intubated it could be done then  Hypotension, on pressor /stress dose steroids  Encephalopathy- metabolic- could be from uremic encephalopathy and possible hypercarbia- ABG is being attempted  Also has been c/o rt hip pain for the past month-will need some kind of imaging MRI/CT but her weight precludes those tests  ESRDpoor access- has femoral line for dialysis  Encephalopathy- metabolic   Discussed with her nurse

## 2018-12-22 NOTE — Progress Notes (Signed)
At approx. 2300 pt was observed going into A. Fib w/ RVR. Pt BP was also decreasing into MAP range of 50-60. E-link MD was notified of changes and orders were placed for Amiodarone bolus, vasopressin gtt, and albumin gtt. Lab orders for H&H and blood type were placed. Pt is currently maxed out on levophed and HR has converted back to sinus rhythm. BP MAPs are in the 60s. Will continue to monitor and titrate down pressors once MAP goal is achieved.

## 2018-12-22 NOTE — Progress Notes (Signed)
[facility]  ROUNDING NOTE   Subjective:   Norepinephrine gtt. 8  Did not get hemodialysis yesterday.   Objective:  Vital signs in last 24 hours:  Temp:  [97.3 F (36.3 C)-98.6 F (37 C)] 98.6 F (37 C) (03/24 0700) Pulse Rate:  [87-100] 87 (03/24 0533) Resp:  [8-24] 14 (03/24 1100) BP: (68-111)/(43-80) 105/74 (03/24 1100) SpO2:  [93 %-100 %] 93 % (03/24 0533) Weight:  [179 kg] 179 kg (03/24 0500)  Weight change:  Filed Weights   12/17/18 1315 12/17/18 1420 12/22/18 0500  Weight: (!) 180.3 kg (!) 180.3 kg (!) 179 kg    Intake/Output: I/O last 3 completed shifts: In: 2179.8 [I.V.:1379.7; IV Piggyback:800.1] Out: 371 [Stool:785]   Intake/Output this shift:  Total I/O In: 365 [I.V.:271.4; IV Piggyback:93.6] Out: -   Physical Exam: General: Critically ill appearing  Head: Normocephalic, atraumatic. Moist oral mucosal membranes  Eyes: Anicteric  Neck: Supple, trachea midline  Lungs:  Diminished bilaterally  Heart: regular  Abdomen:  Soft, nontender, obese  Extremities: 1+ peripheral edema.  Neurologic: Lethargic   Skin: Decubitus ulcers stage 2  Access: R femoral temporary dialysis catheter 3/18 Dr. Lucky Cowboy    Basic Metabolic Panel: Recent Labs  Lab 12/17/18 1702  12/18/18 2113 12/19/18 0751 12/19/18 0752 12/19/18 1933 12/20/18 0521 12/21/18 0447 12/22/18 0440  NA 139   < > 137 137  --   --  136 136 136  K 4.0   < > 2.9* 3.0*  --   --  2.9* 3.4* 3.8  CL 97*   < > 97* 97*  --   --  99 99 101  CO2 24   < > 27 24  --   --  25 24 20*  GLUCOSE 117*   < > 110* 117*  --   --  171* 127* 141*  BUN 92*   < > 30* 41*  --   --  48* 54* 60*  CREATININE 7.59*   < > 3.06* 4.69*  --   --  5.71* 6.46* 6.99*  CALCIUM 8.5*   < > 8.3* 8.6*  --   --  8.8* 8.6* 8.2*  MG  --   --  1.8  --  1.8 1.8  --  2.0  --   PHOS 5.0*  --  2.1*  --  3.2 3.8  --  4.7*  --    < > = values in this interval not displayed.    Liver Function Tests: Recent Labs  Lab  12/08/2018 0439 12/18/18 2113 12/22/18 0440  AST 15 22 20   ALT 10 14 17   ALKPHOS 77 88 77  BILITOT 0.9 0.7 0.6  PROT 6.2* 6.2* 5.2*  ALBUMIN 2.1* 2.0* 1.7*   No results for input(s): LIPASE, AMYLASE in the last 168 hours. No results for input(s): AMMONIA in the last 168 hours.  CBC: Recent Labs  Lab 12/18/18 0331 12/18/18 2113 12/19/18 0751 12/20/18 0521 12/21/18 0447 12/22/18 0440  WBC 6.9 6.1 6.8 9.8 10.3 11.9*  NEUTROABS 4.7  --  4.7 7.4 8.4* 9.9*  HGB 8.2* 8.2* 7.9* 8.2* 8.0* 7.6*  HCT 27.7* 27.6* 27.0* 28.1* 27.4* 25.9*  MCV 90.2 90.2 91.2 92.1 91.6 91.8  PLT 42* 38* 40* 47* 49* 48*    Cardiac Enzymes: No results for input(s): CKTOTAL, CKMB, CKMBINDEX, TROPONINI in the last 168 hours.  BNP: Invalid input(s): POCBNP  CBG: Recent Labs  Lab 12/21/18 1613 12/21/18 2359 12/22/18 0426 12/22/18 0721 12/22/18 1127  GLUCAP 97 96 126* 76* 145*    Microbiology: Results for orders placed or performed during the hospital encounter of 12/21/2018  CULTURE, BLOOD (ROUTINE X 2) w Reflex to ID Panel     Status: Abnormal   Collection Time: 12/15/18  3:09 PM  Result Value Ref Range Status   Specimen Description   Final    BLOOD LEFT HAND Performed at [facility], [address], [place], [place] [address]    Special Requests   Final    AEROBIC BOTTLE ONLY Blood Culture results may not be optimal due to an inadequate volume of blood received in culture bottles Performed at [facility], [address], [place], [place] [address]    Culture  Setup Time   Final    GRAM POSITIVE COCCI AEROBIC BOTTLE ONLY CRITICAL VALUE NOTED.  VALUE IS CONSISTENT WITH PREVIOUSLY REPORTED AND CALLED VALUE. Performed at [facility], [address] [place], [place] [address]    Culture (A)  Final    ENTEROCOCCUS FAECALIS SUSCEPTIBILITIES PERFORMED ON PREVIOUS CULTURE WITHIN THE LAST 5 DAYS. Performed at [facility], [place] [address] [place],  [place] [address]    Report Status 12/18/2018 FINAL  Final  CULTURE, BLOOD (ROUTINE X 2) w Reflex to ID Panel     Status: Abnormal   Collection Time: 12/15/18  3:16 PM  Result Value Ref Range Status   Specimen Description   Final    BLOOD RIGHT HAND Performed at [facility], [address], [place], [place] [address]    Special Requests   Final    BOTTLES DRAWN AEROBIC AND ANAEROBIC Blood Culture results may not be optimal due to an inadequate volume of blood received in culture bottles Performed at [facility], [address] [place], [place] [address]    Culture  Setup Time   Final    GRAM POSITIVE COCCI IN BOTH AEROBIC AND ANAEROBIC BOTTLES CRITICAL RESULT CALLED TO, READ BACK BY AND VERIFIED WITH: Vita Barley HALLHAI 12/17/2018 0448 REC Performed at [facility], [place] [address], [place], [place] [address]    Culture ENTEROCOCCUS FAECALIS (A)  Final   Report Status 12/18/2018 FINAL  Final   Organism ID, Bacteria ENTEROCOCCUS FAECALIS  Final      Susceptibility   Enterococcus faecalis - MIC*    AMPICILLIN <=2 SENSITIVE Sensitive     VANCOMYCIN 1 SENSITIVE Sensitive     GENTAMICIN SYNERGY SENSITIVE Sensitive     * ENTEROCOCCUS FAECALIS  Blood Culture ID Panel (Reflexed)     Status: Abnormal   Collection Time: 12/15/18  3:16 PM  Result Value Ref Range Status   Enterococcus species DETECTED (A) NOT DETECTED Final    Comment: CRITICAL RESULT CALLED TO, READ BACK BY AND VERIFIED WITH: SHEENA HALLHAI 12/08/2018 0448 REC    Vancomycin resistance NOT DETECTED NOT DETECTED Final   Listeria monocytogenes NOT DETECTED NOT DETECTED Final   Staphylococcus species NOT DETECTED NOT DETECTED Final   Staphylococcus aureus (BCID) NOT DETECTED NOT DETECTED Final   Streptococcus species NOT DETECTED NOT DETECTED Final   Streptococcus agalactiae NOT DETECTED NOT DETECTED Final   Streptococcus pneumoniae NOT DETECTED NOT DETECTED Final   Streptococcus pyogenes NOT DETECTED NOT  DETECTED Final   Acinetobacter baumannii NOT DETECTED NOT DETECTED Final   Enterobacteriaceae species NOT DETECTED NOT DETECTED Final   Enterobacter cloacae complex NOT DETECTED NOT DETECTED Final   Escherichia coli NOT DETECTED NOT DETECTED Final   Klebsiella  oxytoca NOT DETECTED NOT DETECTED Final   Klebsiella pneumoniae NOT DETECTED NOT DETECTED Final   Proteus species NOT DETECTED NOT DETECTED Final   Serratia marcescens NOT DETECTED NOT DETECTED Final   Haemophilus influenzae NOT DETECTED NOT DETECTED Final   Neisseria meningitidis NOT DETECTED NOT DETECTED Final   Pseudomonas aeruginosa NOT DETECTED NOT DETECTED Final   Candida albicans NOT DETECTED NOT DETECTED Final   Candida glabrata NOT DETECTED NOT DETECTED Final   Candida krusei NOT DETECTED NOT DETECTED Final   Candida parapsilosis NOT DETECTED NOT DETECTED Final   Candida tropicalis NOT DETECTED NOT DETECTED Final    Comment: Performed at [facility], [place], [place] [address]  Surgical PCR screen     Status: None   Collection Time: 12/14/2018  4:49 AM  Result Value Ref Range Status   MRSA, PCR NEGATIVE NEGATIVE Final   Staphylococcus aureus NEGATIVE NEGATIVE Final    Comment: (NOTE) The Xpert SA Assay (FDA approved for NASAL specimens in patients 34 years of age and older), is one component of a comprehensive surveillance program. It is not intended to diagnose infection nor to guide or monitor treatment. Performed at [facility], [address] [place], [place] [address]   Cath Tip Culture     Status: Abnormal   Collection Time: 12/04/2018  4:38 PM  Result Value Ref Range Status   Specimen Description   Final    CATH TIP Performed at [facility], [address] [place], [place] [address]    Special Requests   Final    NONE Performed at [facility], [address] [place], [place] [address]    Culture (A)  Final    ENTEROCOCCUS  FAECALIS STAPHYLOCOCCUS EPIDERMIDIS    Report Status 12/20/2018 FINAL  Final   Organism ID, Bacteria ENTEROCOCCUS FAECALIS  Final   Organism ID, Bacteria STAPHYLOCOCCUS EPIDERMIDIS  Final      Susceptibility   Enterococcus faecalis - MIC*    AMPICILLIN <=2 SENSITIVE Sensitive     VANCOMYCIN 1 SENSITIVE Sensitive     GENTAMICIN SYNERGY SENSITIVE Sensitive     * ENTEROCOCCUS FAECALIS   Staphylococcus epidermidis - MIC*    CIPROFLOXACIN 4 RESISTANT Resistant     ERYTHROMYCIN >=8 RESISTANT Resistant     GENTAMICIN <=0.5 SENSITIVE Sensitive     OXACILLIN >=4 RESISTANT Resistant     TETRACYCLINE 2 SENSITIVE Sensitive     VANCOMYCIN 2 SENSITIVE Sensitive     TRIMETH/SULFA <=10 SENSITIVE Sensitive     CLINDAMYCIN <=0.25 SENSITIVE Sensitive     RIFAMPIN <=0.5 SENSITIVE Sensitive     Inducible Clindamycin NEGATIVE Sensitive     * STAPHYLOCOCCUS EPIDERMIDIS  C difficile quick scan w PCR reflex     Status: None   Collection Time: 12/17/18  6:25 AM  Result Value Ref Range Status   C Diff antigen NEGATIVE NEGATIVE Final   C Diff toxin NEGATIVE NEGATIVE Final   C Diff interpretation No C. difficile detected.  Final    Comment: Performed at [facility], [address] [place], [place] [address]  CULTURE, BLOOD (ROUTINE X 2) w Reflex to ID Panel     Status: None   Collection Time: 12/17/18  5:02 PM  Result Value Ref Range Status   Specimen Description BLOOD PORTA CATH  Final   Special Requests   Final    BOTTLES DRAWN AEROBIC AND ANAEROBIC Blood Culture adequate volume   Culture   Final  NO GROWTH 5 DAYS Performed at [facility], [address] [place], [place] [address]    Report Status 12/22/2018 FINAL  Final  CULTURE, BLOOD (ROUTINE X 2) w Reflex to ID Panel     Status: None (Preliminary result)   Collection Time: 12/18/18  3:30 AM  Result Value Ref Range Status   Specimen Description BLOOD RIGHT ANTECUBITAL  Final   Special Requests   Final    BOTTLES DRAWN  AEROBIC AND ANAEROBIC Blood Culture results may not be optimal due to an excessive volume of blood received in culture bottles   Culture   Final    NO GROWTH 4 DAYS Performed at [facility], [address] [place],  [address]    Report Status PENDING  Incomplete    Coagulation Studies: No results for input(s): LABPROT, INR in the last 72 hours.  Urinalysis: No results for input(s): COLORURINE, LABSPEC, PHURINE, GLUCOSEU, HGBUR, BILIRUBINUR, KETONESUR, PROTEINUR, UROBILINOGEN, NITRITE, LEUKOCYTESUR in the last 72 hours.  Invalid input(s): APPERANCEUR    Imaging: No results found.   Medications:   . sodium chloride Stopped (12/20/18 1830)  . sodium chloride    . ampicillin (OMNIPEN) IV Stopped (12/22/18 0512)  . anticoagulant sodium citrate    . cefTRIAXone (ROCEPHIN)  IV 200 mL/hr at 12/22/18 1124  . norepinephrine (LEVOPHED) Adult infusion 8 mcg/min (12/22/18 1124)   . buPROPion  300 mg Oral Daily  . Chlorhexidine Gluconate Cloth  6 each Topical Q0600  . cinacalcet  30 mg Oral Q breakfast  . epoetin (EPOGEN/PROCRIT) injection  10,000 Units Intravenous Q M,W,F-HD  . ferric citrate  420 mg Oral TID  . hydrocortisone sod succinate (SOLU-CORTEF) inj  50 mg Intravenous Q6H  . insulin aspart  0-9 Units Subcutaneous Q4H  . lactobacillus  1 g Oral TID WC  . levothyroxine  75 mcg Oral QAC breakfast  . mouth rinse  15 mL Mouth Rinse BID  . metroNIDAZOLE  1 Applicatorful Vaginal QHS  . midodrine  5 mg Oral TID WC  . multivitamin  1 tablet Oral QHS  . pantoprazole (PROTONIX) IV  40 mg Intravenous QHS  . sevelamer carbonate  1,600 mg Oral TID WC   sodium chloride, sodium chloride, acetaminophen **OR** acetaminophen, alteplase, anticoagulant sodium citrate, HYDROcodone-acetaminophen, HYDROmorphone (DILAUDID) injection, iohexol, lidocaine (PF), lidocaine-prilocaine, meclizine, ondansetron (ZOFRAN) IV, ondansetron (ZOFRAN) IV, ondansetron **OR** [DISCONTINUED]  ondansetron (ZOFRAN) IV, pentafluoroprop-tetrafluoroeth, polyethylene glycol  Assessment/ Plan:   Ms. Lindsey Russell is a 48 y.o. black female with past medical history of ESRD on HD, morbid obesity, diabetes mellitus type 2, hypertension, anemia of chronic kidney disease, superior vena cava syndrome. admitted with non functional permcath and sepsis.  Enterococcus faecalis bacteremia with positive catheter tip culture.   [facility] MWF EDW 176.5  1.  ESRD on HD MWF.  Patient completed hemodialysis on Friday.   Requiring vasopressors.  Not stable for intermittent hemodialysis at this time.  Plan on CRRT. Discussed case with husband, Lindsey Russell - orders for CVVHD placed. 4K bath, no UF.   2.  Anemia of chronic kidney disease: hemoglobin 7.6 - Will change EPO to subcu MWF   3. Thrombocytopenia: HIT negative on 3/20. May use heparin.   4. Hypotension with Sepsis/bacteremia: dialysis catheter infection.  Requiring vasopressors and stress dose steroids.  Not getting her midodrine, will hold - Appreciate ID input.  - Ampicillin and ceftriaxone.   5. Secondary Hyperparathyroidism - holding cinacalcet and auryxia  Overall prognosis is poor.  Discussed prognosis with husband over the phone.   LOS: 8 Eitan Doubleday 3/24/202011:30 AM

## 2018-12-22 NOTE — Progress Notes (Signed)
eLink Physician-Brief Progress Note Patient Name: Lindsey Russell DOB: 05/25/1971 MRN: [id number]   Date of Service  12/22/2018  HPI/Events of Note  New onset Afib with RVR, hypotension, CRRT filter clot resulting in a significant amount of blood unable to be given back to patient  eICU Interventions  Albumin 25 % 25 gm iv x 1, Vasopressin infusion, stat H & H  To r/o acute blood loss anemia, Type and Screen, Amiodarone 150 mg iv bolus infusion        Lyah Millirons U Frona Yost 12/22/2018, 10:36 PM
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
## 2018-12-22 NOTE — Consult Note (Signed)
Pharmacy Antibiotic Note  Lindsey Russell is a 48 y.o. female admitted on 12/04/2018 with Vascular Access Problems. BCID now showing Enterococcus species. Pharmacy has been consulted for ampicillin  Dosing. Patient has PMH of ESRD on HD. Patient initiated on CRRT on 3/24.   Plan: Continue Ampicillin 2g IV every 8 hours while on CRRT.   Patient also ordered Ceftriaxone 2g IV Q12hr for synergy in setting of possible endocarditis. Per cardiology, patient not a candidate for TTE at this time.   Height: 5\' 4"  (162.6 cm) Weight: (!) 394 lb 10 oz (179 kg) IBW/kg (Calculated) : 54.7  Temp (24hrs), Avg:97.9 F (36.6 C), Min:97.1 F (36.2 C), Max:98.6 F (37 C)  Recent Labs  Lab 12/18/18 2113 12/19/18 0751 12/20/18 0521 12/21/18 0447 12/22/18 0440 12/22/18 1241 12/22/18 1503  WBC 6.1 6.8 9.8 10.3 11.9*  --   --   CREATININE 3.06* 4.69* 5.71* 6.46* 6.99* 7.10* 5.88*    Estimated Creatinine Clearance: 19.1 mL/min (A) (by C-G formula based on SCr of 5.88 mg/dL (H)).    Allergies  Allergen Reactions  . Morphine And Related   . Naproxen     Antimicrobials this admission: Vancomycin 3/17 x 1 Ceftriaxone 3/17; 3/19 >>  Ampicillin 3/18 >>   Microbiology results: 3/17 BCx: Enterococcus faecalis  3/18 MRSA PCR: MRSA Negative, Staph Aureus Negative  3/19 CDiff: negative   Thank you for allowing pharmacy to be a part of this patient's care.  Dayquan Buys L, 12/22/2018 5:19 PM

## 2018-12-22 NOTE — Progress Notes (Signed)
Pt CRRT cartridge clotted at 2020. Was not able to emergently flush back blood due to spontaneous nature of clot. CRRT was resumed at 2050.

## 2018-12-22 NOTE — Progress Notes (Addendum)
During rounds Dr Mortimer Fries notified that daughter Altamese Dilling would like an update on patients condition. MD notified patients is lethargic, refusing care, and is on 14 mcg of levophed. Patient unable to tolerate po safely at this time. No new orders given and continue to monitor. He will contact family.

## 2018-12-22 NOTE — Progress Notes (Signed)
Patient lethargic and disoriented throughout the shift. Refuses to be turned and bites down when oral care or temperature assessment performed. Moans and grown's, throws arms up. Patient unable to tolerate po's safely. Flexi-seal intact with about 100 ml of output. Levophed titrated to maintain map of 65. CRRT started this afternoon. Patient temperature 94.9, warming blanket placed. Patient unable to tolerate blood flow of 400, adjusted to 300. Dr Abigail Butts notified. ABG performed this afternoon due to patients lethargy. MD aware of results and no new orders given at this time. Continue to monitor.

## 2018-12-22 NOTE — Progress Notes (Signed)
eLink Physician-Brief Progress Note Patient Name: Lindsey Russell DOB: [birth year] MRN: [id number]   Date of Service  12/22/2018  HPI/Events of Note  Persistent hypotension despite restoration of sinus rythm  eICU Interventions  Phenylephrine infusion ordered while waiting for Albumin from the pharmacy.        Frederik Pear 12/22/2018, 11:36 PM

## 2018-12-23 ENCOUNTER — Inpatient Hospital Stay: Payer: Medicare Other

## 2018-12-23 DIAGNOSIS — J9601 Acute respiratory failure with hypoxia: Secondary | ICD-10-CM

## 2018-12-23 DIAGNOSIS — R627 Adult failure to thrive: Secondary | ICD-10-CM

## 2018-12-23 LAB — CBC WITH DIFFERENTIAL/PLATELET
Abs Immature Granulocytes: 1.39 10*3/uL — ABNORMAL HIGH (ref 0.00–0.07)
Basophils Absolute: 0 10*3/uL (ref 0.0–0.1)
Basophils Relative: 0 %
Eosinophils Absolute: 0 10*3/uL (ref 0.0–0.5)
Eosinophils Relative: 0 %
HCT: 24.7 % — ABNORMAL LOW (ref 36.0–46.0)
Hemoglobin: 6.4 g/dL — ABNORMAL LOW (ref 12.0–15.0)
Immature Granulocytes: 11 %
LYMPHS PCT: 17 %
Lymphs Abs: 2.2 10*3/uL (ref 0.7–4.0)
MCH: 26.9 pg (ref 26.0–34.0)
MCHC: 25.9 g/dL — ABNORMAL LOW (ref 30.0–36.0)
MCV: 103.8 fL — ABNORMAL HIGH (ref 80.0–100.0)
Monocytes Absolute: 0.2 10*3/uL (ref 0.1–1.0)
Monocytes Relative: 2 %
Neutro Abs: 9.2 10*3/uL — ABNORMAL HIGH (ref 1.7–7.7)
Neutrophils Relative %: 70 %
Platelets: 40 10*3/uL — ABNORMAL LOW (ref 150–400)
RBC: 2.38 MIL/uL — ABNORMAL LOW (ref 3.87–5.11)
RDW: 18.1 % — ABNORMAL HIGH (ref 11.5–15.5)
WBC: 13 10*3/uL — AB (ref 4.0–10.5)
nRBC: 8.2 % — ABNORMAL HIGH (ref 0.0–0.2)

## 2018-12-23 LAB — COMPREHENSIVE METABOLIC PANEL
ALT: 283 U/L — ABNORMAL HIGH (ref 0–44)
ANION GAP: 25 — AB (ref 5–15)
AST: 796 U/L — ABNORMAL HIGH (ref 15–41)
Albumin: 2 g/dL — ABNORMAL LOW (ref 3.5–5.0)
Alkaline Phosphatase: 87 U/L (ref 38–126)
BUN: 40 mg/dL — ABNORMAL HIGH (ref 6–20)
CO2: 7 mmol/L — ABNORMAL LOW (ref 22–32)
Calcium: 9 mg/dL (ref 8.9–10.3)
Chloride: 104 mmol/L (ref 98–111)
Creatinine, Ser: 4.69 mg/dL — ABNORMAL HIGH (ref 0.44–1.00)
GFR calc non Af Amer: 10 mL/min — ABNORMAL LOW (ref >60)
GFR, EST AFRICAN AMERICAN: 12 mL/min — AB (ref >60)
Glucose, Bld: 120 mg/dL — ABNORMAL HIGH (ref 70–99)
Potassium: 6.5 mmol/L (ref 3.5–5.1)
Sodium: 136 mmol/L (ref 135–145)
Total Bilirubin: 0.5 mg/dL (ref 0.3–1.2)
Total Protein: 5.2 g/dL — ABNORMAL LOW (ref 6.5–8.1)

## 2018-12-23 LAB — CULTURE, BLOOD (ROUTINE X 2): Culture: NO GROWTH

## 2018-12-23 LAB — LACTIC ACID, PLASMA

## 2018-12-23 LAB — TYPE AND SCREEN
ABO/RH(D): O POS
Antibody Screen: NEGATIVE

## 2018-12-23 LAB — APTT: aPTT: 118 seconds — ABNORMAL HIGH (ref 24–36)

## 2018-12-23 LAB — PHOSPHORUS: PHOSPHORUS: 8.6 mg/dL — AB (ref 2.5–4.6)

## 2018-12-23 LAB — MAGNESIUM: Magnesium: 2.7 mg/dL — ABNORMAL HIGH (ref 1.7–2.4)

## 2018-12-23 LAB — TROPONIN I: Troponin I: <0.03 ng/mL (ref <0.03)

## 2018-12-23 LAB — GLUCOSE, CAPILLARY: Glucose-Capillary: 127 mg/dL — ABNORMAL HIGH (ref 70–99)

## 2018-12-23 MED ORDER — SODIUM CHLORIDE 0.9 % IV SOLN
0.0000 ug/min | INTRAVENOUS | Status: DC
  Administered 2018-12-23: 200 ug/min via INTRAVENOUS
  Filled 2018-12-23: qty 4

## 2018-12-23 MED ORDER — ALBUMIN HUMAN 25 % IV SOLN
25.0000 g | Freq: Once | INTRAVENOUS | Status: AC
  Administered 2018-12-23: 25 g via INTRAVENOUS
  Filled 2018-12-23: qty 100

## 2018-12-23 MED ORDER — ETOMIDATE 2 MG/ML IV SOLN
INTRAVENOUS | Status: AC
  Filled 2018-12-23: qty 10

## 2018-12-23 MED ORDER — EPINEPHRINE PF 1 MG/ML IJ SOLN
0.5000 ug/min | INTRAVENOUS | Status: DC
  Administered 2018-12-23 (×2): 20 ug/min via INTRAVENOUS
  Filled 2018-12-23 (×2): qty 4

## 2018-12-23 MED ORDER — EPINEPHRINE PF 1 MG/ML IJ SOLN: 0.5000 ug/min | INTRAVENOUS | Status: DC

## 2018-12-23 MED ORDER — SUCCINYLCHOLINE CHLORIDE 20 MG/ML IJ SOLN
INTRAMUSCULAR | Status: AC
  Filled 2018-12-23: qty 1

## 2018-12-29 ENCOUNTER — Telehealth: Payer: Self-pay

## 2018-12-29 LAB — BLOOD GAS, ARTERIAL
Acid-base deficit: 6.6 mmol/L — ABNORMAL HIGH (ref 0.0–2.0)
Bicarbonate: 16.7 mmol/L — ABNORMAL LOW (ref 20.0–28.0)
FIO2: 0.21
O2 Saturation: 98.4 %
Patient temperature: 37
pCO2 arterial: 27 mmHg — ABNORMAL LOW (ref 32.0–48.0)
pH, Arterial: 7.4 (ref 7.350–7.450)
pO2, Arterial: 113 mmHg — ABNORMAL HIGH (ref 83.0–108.0)

## 2018-12-29 NOTE — Telephone Encounter (Signed)
Death certificate dropped off. Call [facility] home - [facility] when complete. [phone]. Placed in inbox at front desk.

## 2018-12-29 NOTE — Telephone Encounter (Signed)
Death certificate has been placed in DK's folder for signature.

## 2018-12-30 NOTE — Death Summary Note (Signed)
DEATH SUMMARY   Patient Details  Name: Lindsey Russell MRN: [id number] DOB: May 27, 1971  Admission/Discharge Information   Admit Date:  Jan 01, 2019  Date of Death:   01/10/2019   Time of Death:  6555/01/07  Length of Stay: 9  Referring Physician: Marco Collie, MD   Reason(s) for Hospitalization  Admitted for septic shock Encephalopathy Started on vasopressors Started on CRRT S/p cardiac arrest and code blue  Family At bedside, clinical status relayed to family  Updated and notified of patients medical condition-  Progressive multiorgan failure with very low chance of meaningful recovery.  Patient is in dying  Process.  Family understands the situation.  Patient passes away.     Diagnoses  Preliminary cause of death:   septic shock from ENTERO FAECALIS BACTEREMIA  Secondary Diagnoses (including complications and co-morbidities):  Active Problems:   ESRD (end stage renal disease) on dialysis ([place])   Pressure injury of skin   Adult failure to thrive   Palliative care by specialist   DNR (do not resuscitate) discussion       Pertinent Labs and Studies  Significant Diagnostic Studies Dg Abd 1 View  Result Date: January 10, 2019 CLINICAL DATA:  Orogastric tube placement EXAM: ABDOMEN - 1 VIEW COMPARISON:  None. FINDINGS: The orogastric tube is not seen over the abdomen, reference chest x-ray report which shows it to be malpositioned in the neck. Visualized bowel gas pattern is negative. There is artifact from EKG leads. No concerning mass effect or gas collection. IMPRESSION: Nonvisualized orogastric tube over the abdomen, reference contemporaneous chest x-ray. Electronically Signed   By: Monte Fantasia M.D.   On: 2019/01/10 04:25   Ct Head Wo Contrast  Result Date: 12/15/2018 CLINICAL DATA:  Ataxia, stroke suspected EXAM: CT HEAD WITHOUT CONTRAST TECHNIQUE: Contiguous axial images were obtained from the base of the skull through the vertex without intravenous contrast.  COMPARISON:  None. FINDINGS: Brain: Mild cerebral atrophy. No acute intracranial abnormality. Specifically, no hemorrhage, hydrocephalus, mass lesion, acute infarction, or significant intracranial injury. Vascular: No hyperdense vessel or unexpected calcification. Skull: No acute calvarial abnormality. Sinuses/Orbits: Visualized paranasal sinuses and mastoids clear. Orbital soft tissues unremarkable. Other: None IMPRESSION: Mild age advanced cerebral volume loss. No acute intracranial abnormality. Electronically Signed   By: Rolm Baptise M.D.   On: 12/15/2018 19:22   Dg Chest Port 1 View  Result Date: Jan 10, 2019 CLINICAL DATA:  Intubation and orogastric tube placement EXAM: PORTABLE CHEST 1 VIEW COMPARISON:  None. FINDINGS: Malpositioned orogastric tube which is looping over the neck. The endotracheal tube tip is between the clavicular heads and carina. Low volume chest.  No infiltrate. Artifact from EKG leads. These results will be called to the ordering clinician or representative by the Radiologist Assistant, and communication documented in the PACS or zVision Dashboard. IMPRESSION: 1. Endotracheal tube in unremarkable position. 2. Malposition orogastric tube which is looped over the throat. 3. Clear but very low volume lungs. Electronically Signed   By: Monte Fantasia M.D.   On: 10-Jan-2019 04:24    Microbiology Recent Results (from the past 240 hour(s))  CULTURE, BLOOD (ROUTINE X 2) w Reflex to ID Panel     Status: Abnormal   Collection Time: 12/15/18  3:09 PM  Result Value Ref Range Status   Specimen Description   Final    BLOOD LEFT HAND Performed at [facility], [address], [place], [place] [address]    Special Requests   Final    AEROBIC BOTTLE ONLY Blood Culture results may  not be optimal due to an inadequate volume of blood received in culture bottles Performed at [facility], [address] [place], [place] [address]    Culture  Setup Time   Final    GRAM  POSITIVE COCCI AEROBIC BOTTLE ONLY CRITICAL VALUE NOTED.  VALUE IS CONSISTENT WITH PREVIOUSLY REPORTED AND CALLED VALUE. Performed at [facility], [address], [place], [place] [address]    Culture (A)  Final    ENTEROCOCCUS FAECALIS SUSCEPTIBILITIES PERFORMED ON PREVIOUS CULTURE WITHIN THE LAST 5 DAYS. Performed at [facility], [place] [address] [place], [place] [address]    Report Status 12/18/2018 FINAL  Final  CULTURE, BLOOD (ROUTINE X 2) w Reflex to ID Panel     Status: Abnormal   Collection Time: 12/15/18  3:16 PM  Result Value Ref Range Status   Specimen Description   Final    BLOOD RIGHT HAND Performed at [facility], [address], [place], [place] [address]    Special Requests   Final    BOTTLES DRAWN AEROBIC AND ANAEROBIC Blood Culture results may not be optimal due to an inadequate volume of blood received in culture bottles Performed at [facility], [address], [place], [place] [address]    Culture  Setup Time   Final    GRAM POSITIVE COCCI IN BOTH AEROBIC AND ANAEROBIC BOTTLES CRITICAL RESULT CALLED TO, READ BACK BY AND VERIFIED WITH: Vita Barley HALLHAI 12/18/2018 0448 REC Performed at [facility], [place] [address], [place],  [address]    Culture ENTEROCOCCUS FAECALIS (A)  Final   Report Status 12/18/2018 FINAL  Final   Organism ID, Bacteria ENTEROCOCCUS FAECALIS  Final      Susceptibility   Enterococcus faecalis - MIC*    AMPICILLIN <=2 SENSITIVE Sensitive     VANCOMYCIN 1 SENSITIVE Sensitive     GENTAMICIN SYNERGY SENSITIVE Sensitive     * ENTEROCOCCUS FAECALIS  Blood Culture ID Panel (Reflexed)     Status: Abnormal   Collection Time: 12/15/18  3:16 PM  Result Value Ref Range Status   Enterococcus species DETECTED (A) NOT DETECTED Final    Comment: CRITICAL RESULT CALLED TO, READ BACK BY AND VERIFIED WITH: SHEENA HALLHAI 12/22/2018 0448 REC    Vancomycin resistance NOT DETECTED NOT DETECTED Final    Listeria monocytogenes NOT DETECTED NOT DETECTED Final   Staphylococcus species NOT DETECTED NOT DETECTED Final   Staphylococcus aureus (BCID) NOT DETECTED NOT DETECTED Final   Streptococcus species NOT DETECTED NOT DETECTED Final   Streptococcus agalactiae NOT DETECTED NOT DETECTED Final   Streptococcus pneumoniae NOT DETECTED NOT DETECTED Final   Streptococcus pyogenes NOT DETECTED NOT DETECTED Final   Acinetobacter baumannii NOT DETECTED NOT DETECTED Final   Enterobacteriaceae species NOT DETECTED NOT DETECTED Final   Enterobacter cloacae complex NOT DETECTED NOT DETECTED Final   Escherichia coli NOT DETECTED NOT DETECTED Final   Klebsiella oxytoca NOT DETECTED NOT DETECTED Final   Klebsiella pneumoniae NOT DETECTED NOT DETECTED Final   Proteus species NOT DETECTED NOT DETECTED Final   Serratia marcescens NOT DETECTED NOT DETECTED Final   Haemophilus influenzae NOT DETECTED NOT DETECTED Final   Neisseria meningitidis NOT DETECTED NOT DETECTED Final   Pseudomonas aeruginosa NOT DETECTED NOT DETECTED Final   Candida albicans NOT DETECTED NOT DETECTED Final   Candida glabrata NOT DETECTED NOT DETECTED Final   Candida krusei NOT DETECTED NOT DETECTED Final   Candida parapsilosis NOT DETECTED NOT DETECTED Final  Candida tropicalis NOT DETECTED NOT DETECTED Final    Comment: Performed at [facility], [address] [place], [place] [address]  Surgical PCR screen     Status: None   Collection Time: 12/06/2018  4:49 AM  Result Value Ref Range Status   MRSA, PCR NEGATIVE NEGATIVE Final   Staphylococcus aureus NEGATIVE NEGATIVE Final    Comment: (NOTE) The Xpert SA Assay (FDA approved for NASAL specimens in patients 79 years of age and older), is one component of a comprehensive surveillance program. It is not intended to diagnose infection nor to guide or monitor treatment. Performed at [facility], [address] [place], [place] [address]   Cath Tip Culture      Status: Abnormal   Collection Time: 12/18/2018  4:38 PM  Result Value Ref Range Status   Specimen Description   Final    CATH TIP Performed at [facility], [address] [place], [place] [address]    Special Requests   Final    NONE Performed at [facility], [address] [place], [place] [address]    Culture (A)  Final    ENTEROCOCCUS FAECALIS STAPHYLOCOCCUS EPIDERMIDIS    Report Status 12/20/2018 FINAL  Final   Organism ID, Bacteria ENTEROCOCCUS FAECALIS  Final   Organism ID, Bacteria STAPHYLOCOCCUS EPIDERMIDIS  Final      Susceptibility   Enterococcus faecalis - MIC*    AMPICILLIN <=2 SENSITIVE Sensitive     VANCOMYCIN 1 SENSITIVE Sensitive     GENTAMICIN SYNERGY SENSITIVE Sensitive     * ENTEROCOCCUS FAECALIS   Staphylococcus epidermidis - MIC*    CIPROFLOXACIN 4 RESISTANT Resistant     ERYTHROMYCIN >=8 RESISTANT Resistant     GENTAMICIN <=0.5 SENSITIVE Sensitive     OXACILLIN >=4 RESISTANT Resistant     TETRACYCLINE 2 SENSITIVE Sensitive     VANCOMYCIN 2 SENSITIVE Sensitive     TRIMETH/SULFA <=10 SENSITIVE Sensitive     CLINDAMYCIN <=0.25 SENSITIVE Sensitive     RIFAMPIN <=0.5 SENSITIVE Sensitive     Inducible Clindamycin NEGATIVE Sensitive     * STAPHYLOCOCCUS EPIDERMIDIS  C difficile quick scan w PCR reflex     Status: None   Collection Time: 12/17/18  6:25 AM  Result Value Ref Range Status   C Diff antigen NEGATIVE NEGATIVE Final   C Diff toxin NEGATIVE NEGATIVE Final   C Diff interpretation No C. difficile detected.  Final    Comment: Performed at [facility], [address] [place], [place] [address]  CULTURE, BLOOD (ROUTINE X 2) w Reflex to ID Panel     Status: None   Collection Time: 12/17/18  5:02 PM  Result Value Ref Range Status   Specimen Description BLOOD PORTA CATH  Final   Special Requests   Final    BOTTLES DRAWN AEROBIC AND ANAEROBIC Blood Culture adequate volume   Culture   Final    NO GROWTH 5 DAYS Performed  at [facility], [address] [place], [place] [address]    Report Status 12/22/2018 FINAL  Final  CULTURE, BLOOD (ROUTINE X 2) w Reflex to ID Panel     Status: None (Preliminary result)   Collection Time: 12/18/18  3:30 AM  Result Value Ref Range Status   Specimen Description BLOOD RIGHT ANTECUBITAL  Final   Special Requests   Final    BOTTLES DRAWN AEROBIC AND ANAEROBIC Blood Culture results may not be optimal due to an excessive volume of  blood received in culture bottles   Culture   Final    NO GROWTH 4 DAYS Performed at [facility], [address] [place], [place] [address]    Report Status PENDING  Incomplete    Lab Basic Metabolic Panel: Recent Labs  Lab 12/21/18 0447  12/22/18 1241 12/22/18 1503 12/22/18 1923 12/22/18 2244 01/04/19 0311  NA 136   < > 133* 135 133* 135 136  K 3.4*   < > 3.3* 3.8 4.0 4.3 6.5*  CL 99   < > 97* 100 98 100 104  CO2 24   < > 22 21* 16* 16* 7*  GLUCOSE 127*   < > 153* 140* 144* 152* 120*  BUN 54*   < > 59* 52* 49* 38* 40*  CREATININE 6.46*   < > 7.10* 5.88* 5.54* 4.22* 4.69*  CALCIUM 8.6*   < > 8.3* 8.3* 8.3* 8.1* 9.0  MG 2.0  --  1.9  --  2.0 1.8 2.7*  PHOS 4.7*  --  5.5* 4.5 4.9* 4.2 8.6*   < > = values in this interval not displayed.   Liver Function Tests: Recent Labs  Lab 12/18/18 2113 12/22/18 0440 12/22/18 1241 12/22/18 1503 12/22/18 1923 12/22/18 2244 04-Jan-2019 0311  AST 22 20  --   --   --   --  796*  ALT 14 17  --   --   --   --  283*  ALKPHOS 88 77  --   --   --   --  87  BILITOT 0.7 0.6  --   --   --   --  0.5  PROT 6.2* 5.2*  --   --   --   --  5.2*  ALBUMIN 2.0* 1.7* 1.7* 1.7* 1.8* 1.7* 2.0*   No results for input(s): LIPASE, AMYLASE in the last 168 hours. No results for input(s): AMMONIA in the last 168 hours. CBC: Recent Labs  Lab 12/19/18 0751 12/20/18 0521 12/21/18 0447 12/22/18 0440 12/22/18 2244 01-04-2019 0311  WBC 6.8 9.8 10.3 11.9*  --  13.0*  NEUTROABS 4.7 7.4 8.4* 9.9*   --  9.2*  HGB 7.9* 8.2* 8.0* 7.6* 7.6* 6.4*  HCT 27.0* 28.1* 27.4* 25.9* 26.7* 24.7*  MCV 91.2 92.1 91.6 91.8  --  103.8*  PLT 40* 47* 49* 48*  --  40*   Cardiac Enzymes: Recent Labs  Lab 01/04/2019 0311  TROPONINI <0.03   Sepsis Labs: Recent Labs  Lab 12/20/18 0521 12/21/18 0447 12/22/18 0440 01-04-19 0311  WBC 9.8 10.3 11.9* 13.0*  LATICACIDVEN  --   --   --  >11.0*       Lennin Osmond 2019-01-04, 7:05 AM

## 2018-12-30 NOTE — Progress Notes (Signed)
Family At bedside, clinical status relayed to family  Updated and notified of patients medical condition-  Progressive multiorgan failure with very low chance of meaningful recovery.  Patient is in dying  Process and will NOT survive  Family understands the situation.  patient DNR/DNI      Family are satisfied with Plan of action and management. All questions answered  Corrin Parker, M.D.  [facility]  Medical Director [place] Director [facility] Cardio-Pulmonary Department

## 2018-12-30 NOTE — Progress Notes (Signed)
[facility] Progress Note Patient Name: Lindsey Russell DOB: 08/17/1971 MRN: [id number]   Date of Service  January 05, 2019  HPI/Events of Note  Shortly after being turned and cleaned patient had an abrupt change in mental status and I was called. On coming into the room via camera pt noted with agonal respirations and a weak pulse. Difficulty obtaining blood pressure. Instructions given to immediately begin bagging patient. Epinephrine 100 mcgs iv x 2 given at intervals with good blood pressure response initially, she then became pulseless. ACLS protocol instituted, anesthesia called to intubate patient and did successfully intubate patient. ROSC after 2 rounds of CPR. Dr. Jenell Milliner then arrived to assume care of patient.  eICU Interventions  See above note.        Kerry Kass Kc Summerson 01/05/19, 2:59 AM

## 2018-12-30 NOTE — Telephone Encounter (Signed)
Lindsey Russell with family funeral is aware that death certificate is up front and ready for pickup.  Nothing further is needed.

## 2018-12-30 NOTE — Progress Notes (Signed)
Pt extubated s/p expired

## 2018-12-30 NOTE — Progress Notes (Signed)
CRITICAL CARE NOTE  CC  Follow up resp failure and cardiac arrest  SUBJECTIVE Multiorgan failure Remains critically ill Prognosis is poor Intubated On vasopressors  Patient is dying    SIGNIFICANT EVENTS 3.23 remains on pressors admitted fro septic shock 3.24 remains on pressors-added stress dose steroids 3.25 cardiac arrest and CPR-intubated  REVIEW OF SYSTEMS  PATIENT IS UNABLE TO PROVIDE COMPLETE REVIEW OF SYSTEMS DUE TO SEVERE CRITICAL ILLNESS   PHYSICAL EXAMINATION:  PHYSICAL EXAMINATION:  GENERAL:critically ill appearing, +resp distress HEAD: Normocephalic, atraumatic.  EYES: Pupils equal, round, reactive to light.  No scleral icterus.  MOUTH: Moist mucosal membrane. NECK: Supple. No thyromegaly. No nodules. No JVD.  PULMONARY: +rhonchi, +wheezing CARDIOVASCULAR: S1 and S2. Regular rate and rhythm. No murmurs, rubs, or gallops.  GASTROINTESTINAL: Soft, nontender, -distended. No masses. Positive bowel sounds. No hepatosplenomegaly.  MUSCULOSKELETAL: No swelling, clubbing, or edema.  NEUROLOGIC: obtunded SKIN:intact,warm,dry       CULTURE RESULTS   Recent Results (from the past 240 hour(s))  CULTURE, BLOOD (ROUTINE X 2) w Reflex to ID Panel     Status: Abnormal   Collection Time: 12/15/18  3:09 PM  Result Value Ref Range Status   Specimen Description   Final    BLOOD LEFT HAND Performed at [facility], [address], [place], [place] [address]    Special Requests   Final    AEROBIC BOTTLE ONLY Blood Culture results may not be optimal due to an inadequate volume of blood received in culture bottles Performed at [facility], [address], [place], [place] [address]    Culture  Setup Time   Final    GRAM POSITIVE COCCI AEROBIC BOTTLE ONLY CRITICAL VALUE NOTED.  VALUE IS CONSISTENT WITH PREVIOUSLY REPORTED AND CALLED VALUE. Performed at [facility], [address] [place], [place] [address]    Culture (A)  Final   ENTEROCOCCUS FAECALIS SUSCEPTIBILITIES PERFORMED ON PREVIOUS CULTURE WITHIN THE LAST 5 DAYS. Performed at [facility], [place] [address] [place], [place] [address]    Report Status 12/18/2018 FINAL  Final  CULTURE, BLOOD (ROUTINE X 2) w Reflex to ID Panel     Status: Abnormal   Collection Time: 12/15/18  3:16 PM  Result Value Ref Range Status   Specimen Description   Final    BLOOD RIGHT HAND Performed at [facility], [address] [place], [place] [address]    Special Requests   Final    BOTTLES DRAWN AEROBIC AND ANAEROBIC Blood Culture results may not be optimal due to an inadequate volume of blood received in culture bottles Performed at [facility], [address], [place], [place] [address]    Culture  Setup Time   Final    GRAM POSITIVE COCCI IN BOTH AEROBIC AND ANAEROBIC BOTTLES CRITICAL RESULT CALLED TO, READ BACK BY AND VERIFIED WITH: Vita Barley HALLHAI 12/15/2018 0448 REC Performed at [facility], [place] [address] [place], [place] [address]    Culture ENTEROCOCCUS FAECALIS (A)  Final   Report Status 12/18/2018 FINAL  Final   Organism ID, Bacteria ENTEROCOCCUS FAECALIS  Final      Susceptibility   Enterococcus faecalis - MIC*    AMPICILLIN <=2 SENSITIVE Sensitive     VANCOMYCIN 1 SENSITIVE Sensitive     GENTAMICIN SYNERGY SENSITIVE Sensitive     * ENTEROCOCCUS FAECALIS  Blood Culture ID Panel (Reflexed)     Status: Abnormal   Collection Time: 12/15/18  3:16 PM  Result Value Ref Range Status   Enterococcus species DETECTED (A) NOT DETECTED Final    Comment: CRITICAL RESULT CALLED TO, READ BACK BY AND VERIFIED WITH: SHEENA HALLHAI 11/30/2018 0448 REC    Vancomycin resistance NOT DETECTED NOT DETECTED Final   Listeria monocytogenes NOT DETECTED NOT DETECTED Final   Staphylococcus species NOT DETECTED NOT DETECTED Final   Staphylococcus aureus (BCID) NOT DETECTED NOT DETECTED Final   Streptococcus species NOT DETECTED NOT DETECTED Final    Streptococcus agalactiae NOT DETECTED NOT DETECTED Final   Streptococcus pneumoniae NOT DETECTED NOT DETECTED Final   Streptococcus pyogenes NOT DETECTED NOT DETECTED Final   Acinetobacter baumannii NOT DETECTED NOT DETECTED Final   Enterobacteriaceae species NOT DETECTED NOT DETECTED Final   Enterobacter cloacae complex NOT DETECTED NOT DETECTED Final   Escherichia coli NOT DETECTED NOT DETECTED Final   Klebsiella oxytoca NOT DETECTED NOT DETECTED Final   Klebsiella pneumoniae NOT DETECTED NOT DETECTED Final   Proteus species NOT DETECTED NOT DETECTED Final   Serratia marcescens NOT DETECTED NOT DETECTED Final   Haemophilus influenzae NOT DETECTED NOT DETECTED Final   Neisseria meningitidis NOT DETECTED NOT DETECTED Final   Pseudomonas aeruginosa NOT DETECTED NOT DETECTED Final   Candida albicans NOT DETECTED NOT DETECTED Final   Candida glabrata NOT DETECTED NOT DETECTED Final   Candida krusei NOT DETECTED NOT DETECTED Final   Candida parapsilosis NOT DETECTED NOT DETECTED Final   Candida tropicalis NOT DETECTED NOT DETECTED Final    Comment: Performed at [facility], [place], [place] [address]  Surgical PCR screen     Status: None   Collection Time: 12/10/2018  4:49 AM  Result Value Ref Range Status   MRSA, PCR NEGATIVE NEGATIVE Final   Staphylococcus aureus NEGATIVE NEGATIVE Final    Comment: (NOTE) The Xpert SA Assay (FDA approved for NASAL specimens in patients 62 years of age and older), is one component of a comprehensive surveillance program. It is not intended to diagnose infection nor to guide or monitor treatment. Performed at [facility], [address] [place], [place] [address]   Cath Tip Culture     Status: Abnormal   Collection Time: 12/18/2018  4:38 PM  Result Value Ref Range Status   Specimen Description   Final    CATH TIP Performed at [facility], [address] [place], [place] [address]    Special Requests    Final    NONE Performed at [facility], [address] [place],  [address]    Culture (A)  Final    ENTEROCOCCUS FAECALIS STAPHYLOCOCCUS EPIDERMIDIS    Report Status 12/20/2018 FINAL  Final   Organism ID, Bacteria ENTEROCOCCUS FAECALIS  Final   Organism ID, Bacteria STAPHYLOCOCCUS EPIDERMIDIS  Final      Susceptibility   Enterococcus faecalis - MIC*    AMPICILLIN <=2 SENSITIVE Sensitive     VANCOMYCIN 1 SENSITIVE Sensitive     GENTAMICIN SYNERGY SENSITIVE Sensitive     * ENTEROCOCCUS FAECALIS   Staphylococcus epidermidis - MIC*    CIPROFLOXACIN 4 RESISTANT Resistant     ERYTHROMYCIN >=8 RESISTANT Resistant     GENTAMICIN <=0.5 SENSITIVE Sensitive     OXACILLIN >=4 RESISTANT Resistant     TETRACYCLINE 2 SENSITIVE Sensitive     VANCOMYCIN 2 SENSITIVE Sensitive     TRIMETH/SULFA <=10 SENSITIVE Sensitive     CLINDAMYCIN <=0.25 SENSITIVE Sensitive     RIFAMPIN <=0.5 SENSITIVE Sensitive  Inducible Clindamycin NEGATIVE Sensitive     * STAPHYLOCOCCUS EPIDERMIDIS  C difficile quick scan w PCR reflex     Status: None   Collection Time: 12/17/18  6:25 AM  Result Value Ref Range Status   C Diff antigen NEGATIVE NEGATIVE Final   C Diff toxin NEGATIVE NEGATIVE Final   C Diff interpretation No C. difficile detected.  Final    Comment: Performed at [facility], [address] [place], [place] [address]  CULTURE, BLOOD (ROUTINE X 2) w Reflex to ID Panel     Status: None   Collection Time: 12/17/18  5:02 PM  Result Value Ref Range Status   Specimen Description BLOOD PORTA CATH  Final   Special Requests   Final    BOTTLES DRAWN AEROBIC AND ANAEROBIC Blood Culture adequate volume   Culture   Final    NO GROWTH 5 DAYS Performed at [facility], [address], [place], [place] [address]    Report Status 12/22/2018 FINAL  Final  CULTURE, BLOOD (ROUTINE X 2) w Reflex to ID Panel     Status: None (Preliminary result)   Collection Time: 12/18/18   3:30 AM  Result Value Ref Range Status   Specimen Description BLOOD RIGHT ANTECUBITAL  Final   Special Requests   Final    BOTTLES DRAWN AEROBIC AND ANAEROBIC Blood Culture results may not be optimal due to an excessive volume of blood received in culture bottles   Culture   Final    NO GROWTH 4 DAYS Performed at [facility], [address] [place], [place] [address]    Report Status PENDING  Incomplete             ASSESSMENT AND PLAN SYNOPSIS  Severe Hypoxic and Hypercapnic Respiratory Failure from septic shock ENTERO FAECALIS SPECIES bacteremia 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 -continue Mechanical Ventilator support -continue Bronchodilator Therapy -Wean Fio2 and PEEP as tolerated -VAP/VENT bundle implementation   Renal Failure -follow chem 7 -follow UO -continue Foley Catheter-assess need On CRRT   NEUROLOGY Signs of anoxic brain injury Prognosis is grave  Septic shock -use vasopressors to keep MAP>65 -follow ABG and LA -follow up cultures -emperic ABX -consider stress dose steroids -aggressive IV fluid Resuscitation  INFECTIOUS DISEASE -continue antibiotics as prescribed -follow up cultures -follow up ID consultation    GI/Nutrition GI PROPHYLAXIS as indicated DIET-->TF's as tolerated Constipation protocol as indicated  ELECTROLYTES -follow labs as needed -replace as needed -pharmacy consultation and following    DVT/GI PRX ordered TRANSFUSIONS AS NEEDED MONITOR FSBS ASSESS the need for LABS as needed      Critical Care Time devoted to patient care services described in this note is 43  minutes.   Overall, patient is critically ill, prognosis is guarded.  Patient with Multiorgan failure and at high risk for cardiac arrest and death.   Recommend DNR and comfort care measures  Corrin Parker, M.D.  [facility]  Medical Director [place] Director [place] Department

## 2018-12-30 NOTE — Progress Notes (Signed)
Pt pronounced dead at 0656 by Vilinda Boehringer, RN and writing RN.  Dr Corbin Ade notified.  Family at bedside.  Patient's husband arrived approx 1 minute after pt pronounced dead.  All family at bedside at this time.  Chaplain available to them as needed and assisted them to find a funeral home.

## 2018-12-30 NOTE — Progress Notes (Signed)
Unable to obtain  Abg provider notified .

## 2018-12-30 NOTE — Progress Notes (Signed)
Pt experienced acute mental status change after bath at approx 0230. Pt became unresponsive to pain and pupils became dilated and fixated, pt also began agonal respirations and a blood pressure could not be obtained. Epi 100 mcg x2 was given per Lucile Shutters, MD. Shortly after Pt pulse was lost and ACLS was began, pt was intubated during code. CRRT was stopped before code. Epinephrine gtt and albumin gtt was ordered after ROSC was achieved. [facility] were ordered and on call MD was made aware of situation. Family was notified and arrived at approx 0600. MD spoke with family and pt was made DNR w/ nurse may may pronounce order. Pt passed at 0656. Family currently at bedside with chaplain present.

## 2018-12-30 DEATH — deceased

## 2018-12-31 ENCOUNTER — Telehealth: Payer: Self-pay

## 2018-12-31 NOTE — Telephone Encounter (Signed)
Follow Up:    Lindsey Russell is waiting outside to get the death certificate please.

## 2018-12-31 NOTE — Telephone Encounter (Signed)
Death certificate has been given to Lindsey Russell. Nothing further is needed.

## 2019-08-12 IMAGING — CT CT HEAD WITHOUT CONTRAST
3 of 4 series · 15 of 47 positions shown, 18 images · non-contrast
Comparison: None.

CLINICAL DATA: Ataxia, stroke suspected

EXAM:
CT HEAD WITHOUT CONTRAST
TECHNIQUE: Contiguous axial images were obtained from the base of the skull
through the vertex without intravenous contrast.

[Series 3: head wo · axial · 0.47mm/px · z∈[-141,-16]mm · 9 of 31 slices shown, 12 images]
[im 3/31  brain]
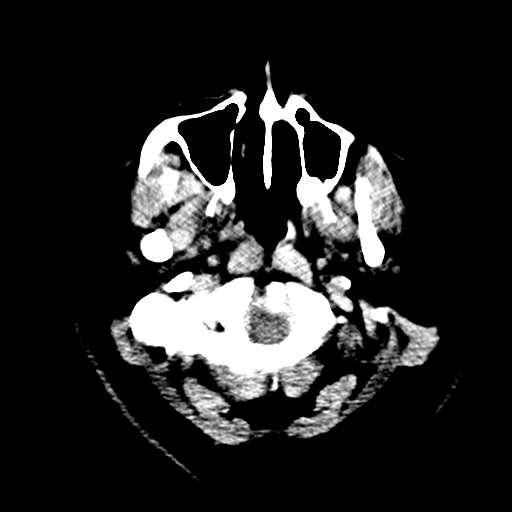
[im 3/31  bone]
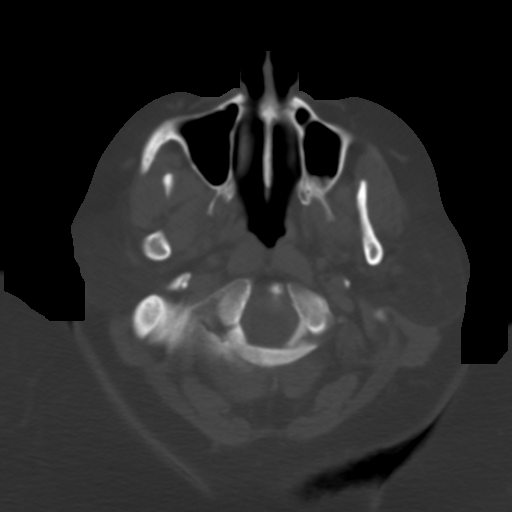
[im 7/31  brain]
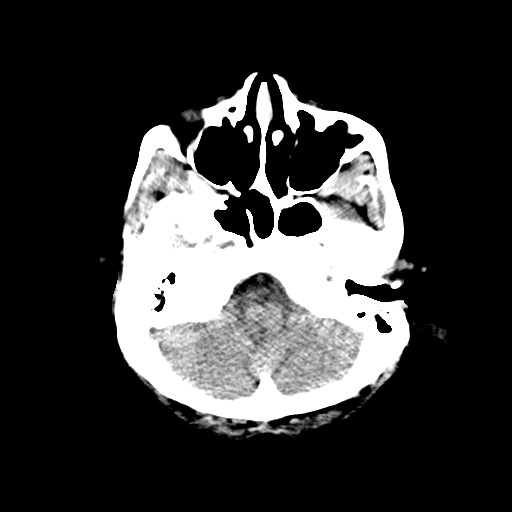
[im 9/31  brain]
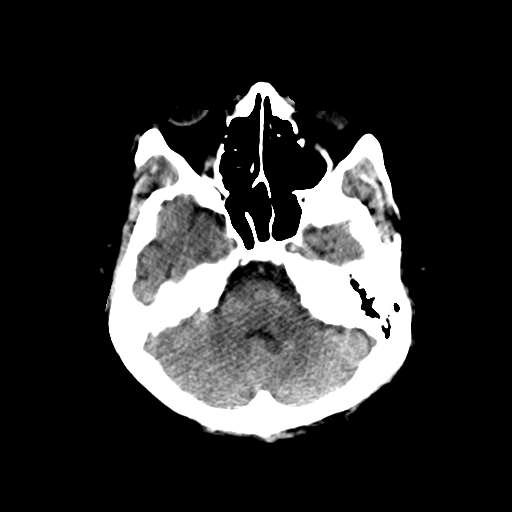
[im 13/31  brain]
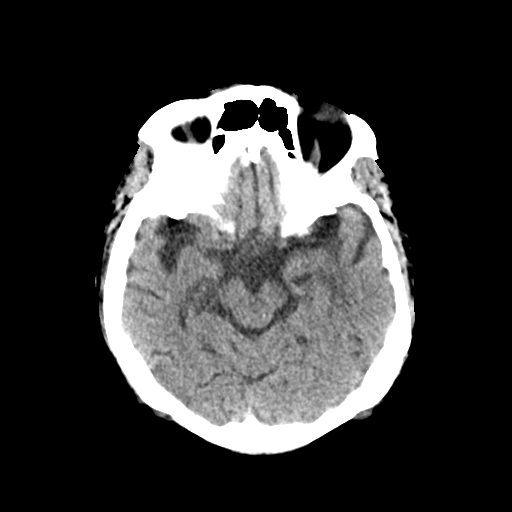
[im 16/31  brain]
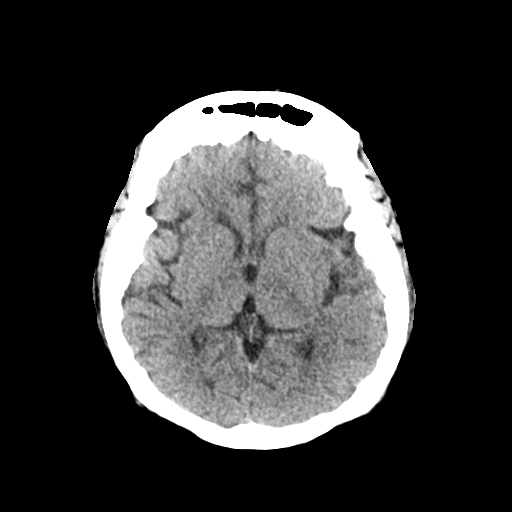
[im 16/31  bone]
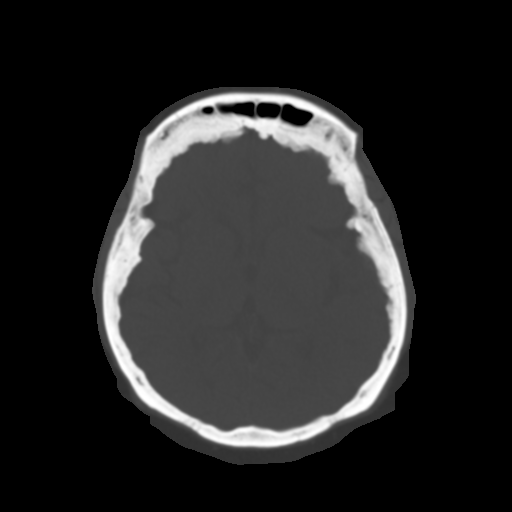
[im 18/31  brain]
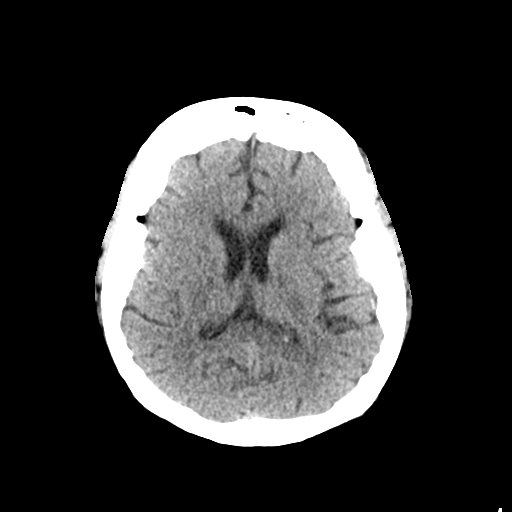
[im 22/31  brain]
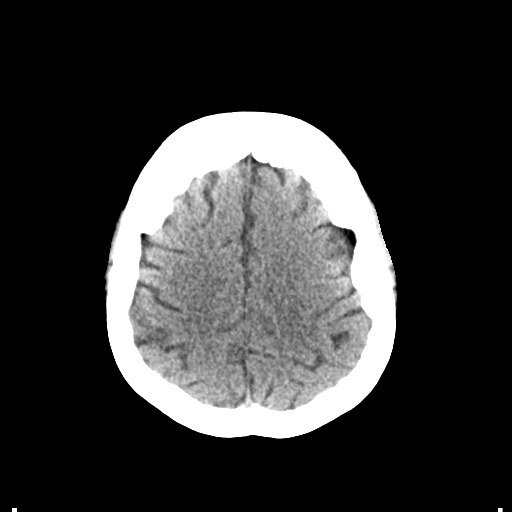
[im 24/31  brain]
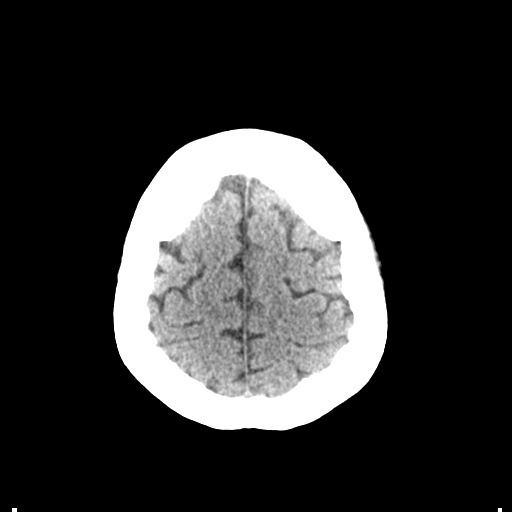
[im 28/31  brain]
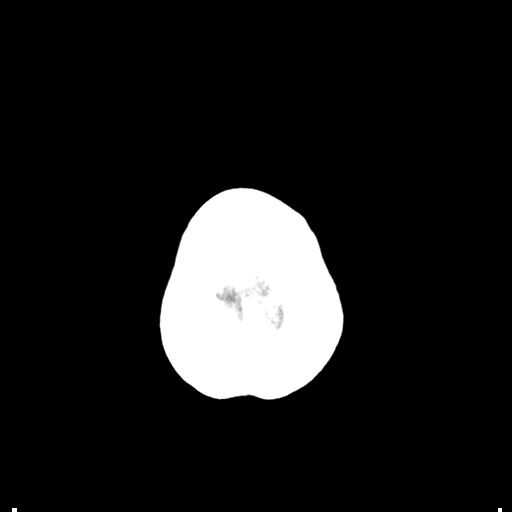
[im 28/31  bone]
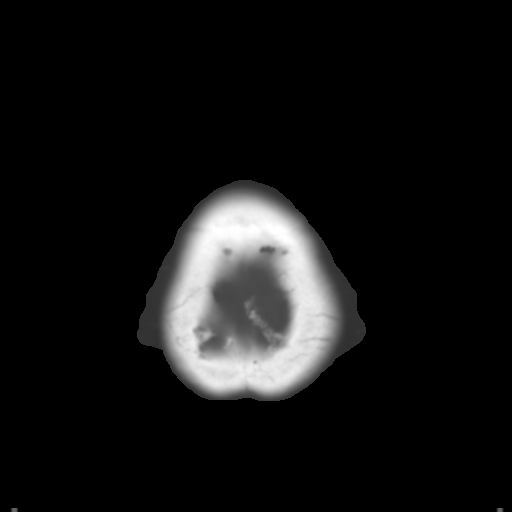

[Series 6: coronal soft tissue · coronal · 0.31mm/px · 3 of 56 slices shown]
[im 19/56  brain]
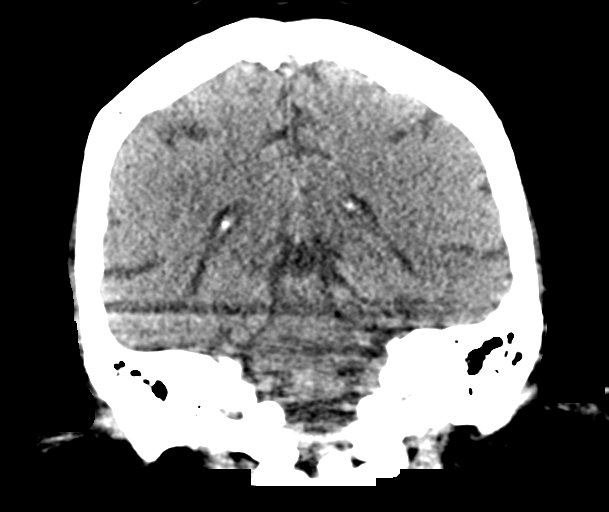
[im 25/56  brain]
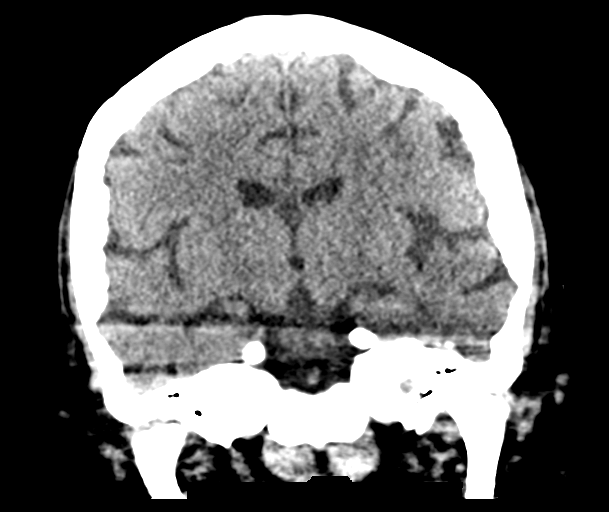
[im 31/56  brain]
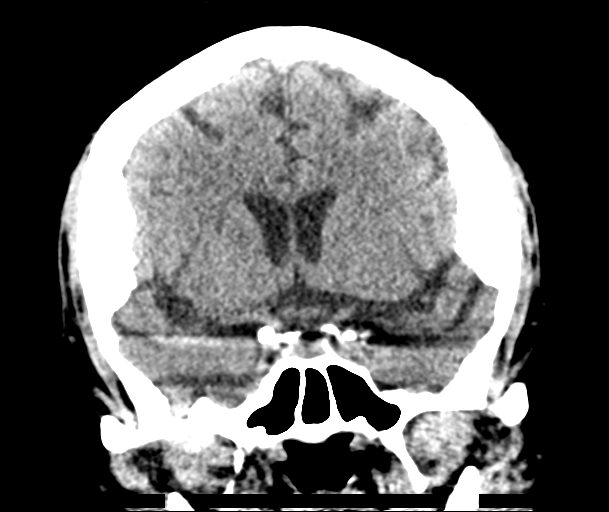

[Series 7: sagittal soft tissue · sagittal · 0.30mm/px · 3 of 53 slices shown]
[im 18/53  brain]
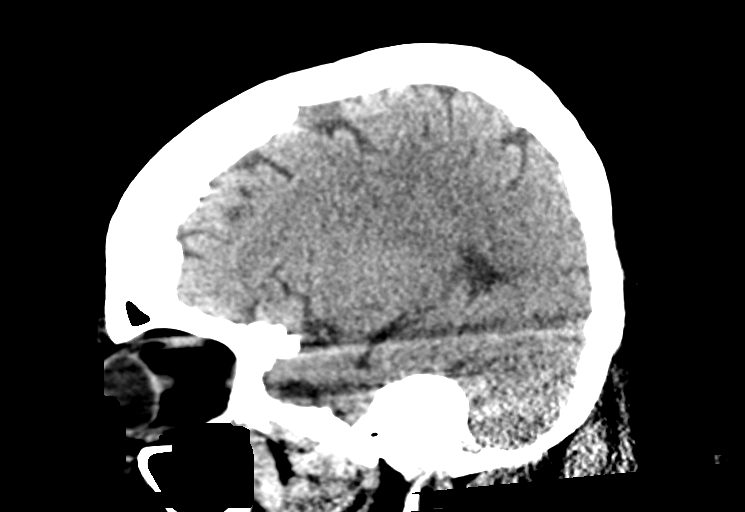
[im 27/53  brain]
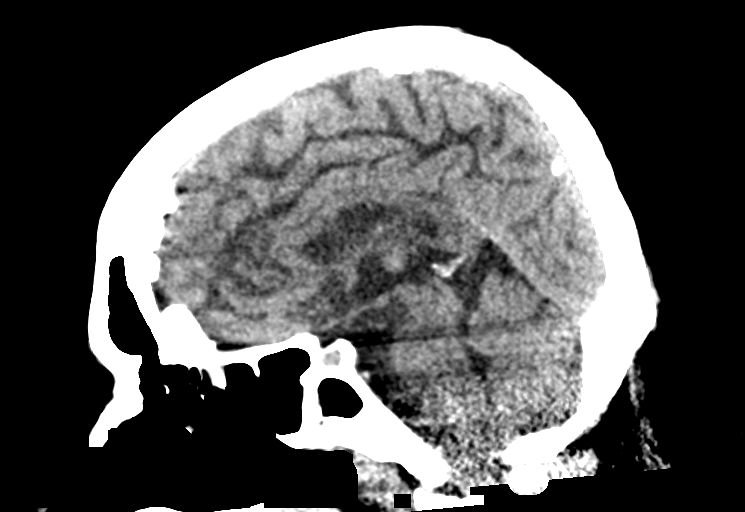
[im 35/53  brain]
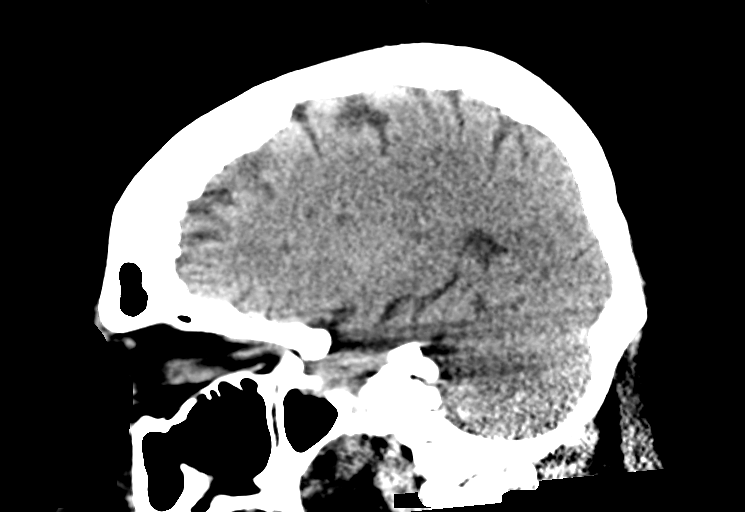

[15 of 47 positions shown; findings below may reference images not displayed]

FINDINGS: Brain: Mild cerebral atrophy. No acute intracranial abnormality.
Specifically, no hemorrhage, hydrocephalus, mass lesion, acute
infarction, or significant intracranial injury.

Vascular: No hyperdense vessel or unexpected calcification.

Skull: No acute calvarial abnormality.

Sinuses/Orbits: Visualized paranasal sinuses and mastoids clear.
Orbital soft tissues unremarkable.

Other: None
IMPRESSION: Mild age advanced cerebral volume loss. No acute intracranial
abnormality.
